# Patient Record
Sex: Female | Born: 1950 | Hispanic: No | Marital: Married | Smoking: Never smoker
Health system: Southern US, Community
[De-identification: ages and names within clinical notes are randomized; demographics above are authoritative.]

## PROBLEM LIST (undated history)

## (undated) DIAGNOSIS — I671 Cerebral aneurysm, nonruptured: Secondary | ICD-10-CM

## (undated) DIAGNOSIS — I517 Cardiomegaly: Secondary | ICD-10-CM

## (undated) DIAGNOSIS — I5030 Unspecified diastolic (congestive) heart failure: Secondary | ICD-10-CM

## (undated) DIAGNOSIS — I10 Essential (primary) hypertension: Secondary | ICD-10-CM

## (undated) DIAGNOSIS — H4922 Sixth [abducent] nerve palsy, left eye: Secondary | ICD-10-CM

## (undated) DIAGNOSIS — G4733 Obstructive sleep apnea (adult) (pediatric): Secondary | ICD-10-CM

## (undated) DIAGNOSIS — E785 Hyperlipidemia, unspecified: Secondary | ICD-10-CM

## (undated) DIAGNOSIS — C9110 Chronic lymphocytic leukemia of B-cell type not having achieved remission: Secondary | ICD-10-CM

## (undated) DIAGNOSIS — D509 Iron deficiency anemia, unspecified: Secondary | ICD-10-CM

## (undated) DIAGNOSIS — H40059 Ocular hypertension, unspecified eye: Secondary | ICD-10-CM

## (undated) DIAGNOSIS — H532 Diplopia: Secondary | ICD-10-CM

## (undated) DIAGNOSIS — N184 Chronic kidney disease, stage 4 (severe): Secondary | ICD-10-CM

## (undated) HISTORY — PX: ROTATOR CUFF REPAIR: SHX139

## (undated) HISTORY — DX: Ocular hypertension, unspecified eye: H40.059

## (undated) HISTORY — PX: CARPAL TUNNEL RELEASE: SHX101

## (undated) HISTORY — PX: KIDNEY CYST REMOVAL: SHX684

## (undated) HISTORY — DX: Chronic lymphocytic leukemia of B-cell type not having achieved remission: C91.10

## (undated) HISTORY — DX: Cerebral aneurysm, nonruptured: I67.1

## (undated) HISTORY — DX: Hyperlipidemia, unspecified: E78.5

## (undated) HISTORY — PX: ABDOMINAL HYSTERECTOMY: SHX81

## (undated) HISTORY — DX: Cardiomegaly: I51.7

## (undated) HISTORY — DX: Unspecified diastolic (congestive) heart failure: I50.30

## (undated) HISTORY — DX: Diplopia: H53.2

## (undated) HISTORY — DX: Sixth (abducent) nerve palsy, left eye: H49.22

## (undated) HISTORY — DX: Obstructive sleep apnea (adult) (pediatric): G47.33

## (undated) HISTORY — DX: Iron deficiency anemia, unspecified: D50.9

## (undated) HISTORY — DX: Chronic kidney disease, stage 4 (severe): N18.4

---

## 2011-12-19 ENCOUNTER — Emergency Department (HOSPITAL_BASED_OUTPATIENT_CLINIC_OR_DEPARTMENT_OTHER)
Admission: EM | Admit: 2011-12-19 | Discharge: 2011-12-20 | Disposition: A | Discharge status: Home / Self Care | Payer: Medicare Other | Attending: Emergency Medicine | Admitting: Emergency Medicine

## 2011-12-19 ENCOUNTER — Encounter (HOSPITAL_BASED_OUTPATIENT_CLINIC_OR_DEPARTMENT_OTHER): Payer: Self-pay | Admitting: Emergency Medicine

## 2011-12-19 DIAGNOSIS — Z79899 Other long term (current) drug therapy: Secondary | ICD-10-CM | POA: Insufficient documentation

## 2011-12-19 DIAGNOSIS — R609 Edema, unspecified: Secondary | ICD-10-CM

## 2011-12-19 DIAGNOSIS — I10 Essential (primary) hypertension: Secondary | ICD-10-CM | POA: Insufficient documentation

## 2011-12-19 DIAGNOSIS — E119 Type 2 diabetes mellitus without complications: Secondary | ICD-10-CM | POA: Insufficient documentation

## 2011-12-19 DIAGNOSIS — Z856 Personal history of leukemia: Secondary | ICD-10-CM | POA: Insufficient documentation

## 2011-12-19 HISTORY — DX: Essential (primary) hypertension: I10

## 2011-12-19 NOTE — ED Notes (Signed)
Around 7pm reports swelling and fever in lower extremities.  C/o a tight feeling.

## 2011-12-20 LAB — CBC WITH DIFFERENTIAL/PLATELET
Basophils Absolute: 0 10*3/uL (ref 0.0–0.1)
Basophils Relative: 0 % (ref 0–1)
Eosinophils Absolute: 0.1 10*3/uL (ref 0.0–0.7)
HCT: 37.7 % (ref 36.0–46.0)
Hemoglobin: 12.4 g/dL (ref 12.0–15.0)
Lymphs Abs: 8 10*3/uL — ABNORMAL HIGH (ref 0.7–4.0)
MCH: 21.9 pg — ABNORMAL LOW (ref 26.0–34.0)
MCHC: 32.9 g/dL (ref 30.0–36.0)
Monocytes Absolute: 0.5 10*3/uL (ref 0.1–1.0)
Neutro Abs: 2.1 10*3/uL (ref 1.7–7.7)

## 2011-12-20 LAB — COMPREHENSIVE METABOLIC PANEL
AST: 19 U/L (ref 0–37)
Albumin: 3.2 g/dL — ABNORMAL LOW (ref 3.5–5.2)
Alkaline Phosphatase: 128 U/L — ABNORMAL HIGH (ref 39–117)
BUN: 15 mg/dL (ref 6–23)
Chloride: 95 mEq/L — ABNORMAL LOW (ref 96–112)
Potassium: 3.4 mEq/L — ABNORMAL LOW (ref 3.5–5.1)
Sodium: 135 mEq/L (ref 135–145)
Total Bilirubin: 0.2 mg/dL — ABNORMAL LOW (ref 0.3–1.2)
Total Protein: 7.5 g/dL (ref 6.0–8.3)

## 2011-12-20 LAB — TROPONIN I: Troponin I: <0.3 ng/mL (ref <0.30)

## 2011-12-20 MED ORDER — FUROSEMIDE 10 MG/ML IJ SOLN
40.0000 mg | Freq: Once | INTRAMUSCULAR | Status: AC
  Administered 2011-12-20: 40 mg via INTRAVENOUS
  Filled 2011-12-20: qty 4

## 2011-12-20 NOTE — ED Provider Notes (Signed)
History     CSN: [id number]  Arrival date & time 12/19/11  2234   First MD Initiated Contact with Patient 12/20/11 0124      Chief Complaint  Patient presents with  . Leg Swelling    (Consider location/radiation/quality/duration/timing/severity/associated sxs/prior treatment) HPI This is a 61 year old black female who states that her legs and ankles became swollen yesterday evening about 7 PM. She states they feel tight and there is burning discomfort in them. The symptoms are mild to moderate. There is no known exacerbating or mitigating factor. There is no erythema or warmth. She was short of breath for the last several days but attributes this to putting moth balls in her house. She saw her primary care physician yesterday who told her that she found no acute problem with her breathing. She denies chest pain, nausea or vomiting. She's not aware of being on any diuretic to treat this.  Past Medical History  Diagnosis Date  . Diabetes mellitus   . Hypertension   . Leukemia     Past Surgical History  Procedure Date  . Carpal tunnel release   . Abdominal hysterectomy   . Kidney cyst removal   . Rotator cuff repair     No family history on file.  History  Substance Use Topics  . Smoking status: Not on file  . Smokeless tobacco: Not on file  . Alcohol Use:     OB History    Grav Para Term Preterm Abortions TAB SAB Ect Mult Living                  Review of Systems  All other systems reviewed and are negative.    Allergies  Review of patient's allergies indicates not on file.  Home Medications   Current Outpatient Rx  Name Route Sig Dispense Refill  . BENAZEPRIL HCL 20 MG PO TABS Oral Take 20 mg by mouth daily.    Marland Kitchen HYDROCHLOROTHIAZIDE 25 MG PO TABS Oral Take 25 mg by mouth daily.    Marland Kitchen METOPROLOL SUCCINATE ER 100 MG PO TB24 Oral Take 100 mg by mouth 2 (two) times daily. Take with or immediately following a meal.    . NIFEDIPINE ER OSMOTIC 90 MG PO TB24 Oral  Take 90 mg by mouth daily.    Marland Kitchen OMEPRAZOLE 40 MG PO CPDR Oral Take 40 mg by mouth daily.    Marland Kitchen PRAVASTATIN SODIUM 80 MG PO TABS Oral Take 80 mg by mouth daily.      BP 154/65  Pulse 70  Temp 98.1 F (36.7 C) (Oral)  Resp 16  Ht 5\' 2"  (1.575 m)  Wt 258 lb (117.028 kg)  BMI 47.19 kg/m2  SpO2 98%  Physical Exam General: Well-developed, well-nourished female in no acute distress; appearance consistent with age of record HENT: normocephalic, atraumatic Eyes: pupils equal round and reactive to light; extraocular muscles intact; arcus senilis bilaterally Neck: supple Heart: regular rate and rhythm Lungs: clear to auscultation bilaterally Abdomen: soft; nondistended; nontender; bowel sounds present Extremities: No deformity; full range of motion; +1 edema of lower legs and feet Neurologic: Awake, alert and oriented; motor function intact in all extremities and symmetric; no facial droop Skin: Warm and dry Psychiatric: Normal mood and affect    ED Course  Procedures (including critical care time)     MDM   Nursing notes and vitals signs, including pulse oximetry, reviewed.  Summary of this visit's results, reviewed by myself:  Labs:  Results for orders placed  during the hospital encounter of 12/19/11  CBC WITH DIFFERENTIAL      Component Value Range   WBC 10.7 (*) 4.0 - 10.5 K/uL   RBC 5.67 (*) 3.87 - 5.11 MIL/uL   Hemoglobin 12.4  12.0 - 15.0 g/dL   HCT 37.7  36.0 - 46.0 %   MCV 66.5 (*) 78.0 - 100.0 fL   MCH 21.9 (*) 26.0 - 34.0 pg   MCHC 32.9  30.0 - 36.0 g/dL   RDW 14.7  11.5 - 15.5 %   Platelets 226  150 - 400 K/uL   Neutrophils Relative 20 (*) 43 - 77 %   Lymphocytes Relative 74 (*) 12 - 46 %   Monocytes Relative 5  3 - 12 %   Eosinophils Relative 1  0 - 5 %   Basophils Relative 0  0 - 1 %   Neutro Abs 2.1  1.7 - 7.7 K/uL   Lymphs Abs 8.0 (*) 0.7 - 4.0 K/uL   Monocytes Absolute 0.5  0.1 - 1.0 K/uL   Eosinophils Absolute 0.1  0.0 - 0.7 K/uL   Basophils  Absolute 0.0  0.0 - 0.1 K/uL   RBC Morphology POLYCHROMASIA PRESENT     WBC Morphology ATYPICAL LYMPHOCYTES    COMPREHENSIVE METABOLIC PANEL      Component Value Range   Sodium 135  135 - 145 mEq/L   Potassium 3.4 (*) 3.5 - 5.1 mEq/L   Chloride 95 (*) 96 - 112 mEq/L   CO2 28  19 - 32 mEq/L   Glucose, Bld 325 (*) 70 - 99 mg/dL   BUN 15  6 - 23 mg/dL   Creatinine, Ser 0.90  0.50 - 1.10 mg/dL   Calcium 9.1  8.4 - 10.5 mg/dL   Total Protein 7.5  6.0 - 8.3 g/dL   Albumin 3.2 (*) 3.5 - 5.2 g/dL   AST 19  0 - 37 U/L   ALT 13  0 - 35 U/L   Alkaline Phosphatase 128 (*) 39 - 117 U/L   Total Bilirubin 0.2 (*) 0.3 - 1.2 mg/dL   GFR calc non Af Amer 67 (*) >90 mL/min   GFR calc Af Amer 78 (*) >90 mL/min  TROPONIN I      Component Value Range   Troponin I <0.30  <0.30 ng/mL  PRO B NATRIURETIC PEPTIDE      Component Value Range   Pro B Natriuretic peptide (BNP) 59.6  0 - 125 pg/mL  D-DIMER, QUANTITATIVE      Component Value Range   D-Dimer, Quant 0.46  0.00 - 0.48 ug/mL-FEU   2:50 AM Patient advised of essentially benign workup. The source of her lower extremities swelling is not evident. She was advised that lower extremity edema are often difficult to diagnose and treat but she should contact her primary care physician Monday.   EKG Interpretation:  Date & Time: 12/20/2011 2:15 AM  Rate: 64  Rhythm: normal sinus rhythm  QRS Axis: normal  Intervals: normal  ST/T Wave abnormalities: T-wave inversions inferolaterally  Conduction Disutrbances:none  Narrative Interpretation: LVH  Old EKG Reviewed: none available         Wynetta Fines, MD 12/20/11 [id number]

## 2017-09-02 ENCOUNTER — Institutional Professional Consult (permissible substitution): Payer: Medicare Other | Admitting: Pulmonary Disease

## 2017-09-03 ENCOUNTER — Institutional Professional Consult (permissible substitution): Payer: Medicare Other | Admitting: Pulmonary Disease

## 2017-10-29 ENCOUNTER — Ambulatory Visit (INDEPENDENT_AMBULATORY_CARE_PROVIDER_SITE_OTHER): Payer: Medicare HMO | Admitting: Pulmonary Disease

## 2017-10-29 ENCOUNTER — Encounter: Payer: Self-pay | Admitting: Pulmonary Disease

## 2017-10-29 VITALS — BP 136/78 | HR 87 | Ht 62.0 in | Wt 292.0 lb

## 2017-10-29 DIAGNOSIS — G4733 Obstructive sleep apnea (adult) (pediatric): Secondary | ICD-10-CM | POA: Diagnosis not present

## 2017-10-29 DIAGNOSIS — R0683 Snoring: Secondary | ICD-10-CM | POA: Diagnosis not present

## 2017-10-29 DIAGNOSIS — G4719 Other hypersomnia: Secondary | ICD-10-CM | POA: Diagnosis not present

## 2017-10-29 DIAGNOSIS — Z6841 Body Mass Index (BMI) 40.0 and over, adult: Secondary | ICD-10-CM

## 2017-10-29 NOTE — Progress Notes (Signed)
   Subjective:    Patient ID: Amy Bush, female    DOB: 05/11/1950, 67 y.o.   MRN: [id number]  HPI    Review of Systems  Constitutional: Positive for fatigue.  HENT: Positive for congestion.   Respiratory: Positive for apnea, cough, shortness of breath and wheezing.        Objective:   Physical Exam        Assessment & Plan:

## 2017-10-29 NOTE — Patient Instructions (Signed)
Will arrange for home sleep study Will call to arrange for follow up after sleep study reviewed  

## 2017-10-29 NOTE — Progress Notes (Signed)
[facility]  Chief Complaint  Patient presents with  . Apnea    Constitutional: BP 136/78 (BP Location: Left Arm, Cuff Size: Large)   Pulse 87   Ht 5\' 2"  (1.575 m)   Wt 292 lb (132.5 kg)   SpO2 93%   BMI 53.41 kg/m   History of Present Illness: Amy Bush is a 67 y.o. female with obstructive sleep apnea.  She has noticed trouble with her sleep for years.  This has been getting worse.  She falls asleep all the time, but can't stay asleep.  She wakes up feeling choked.  She snores and has been told she stops breathing while asleep.  She has to sleep sitting up.  She had a sleep study about 3 yrs ago and was told she had sleep apnea.  Tried on CPAP.  This helped, but she couldn't get used to the mask.  Apparently only tried full face mask.  Eventually turned machine back in.  She has been falling asleep at [facility].  Several of her church members have encouraged her to look into therapy for sleep apnea again.  She goes to sleep at 11 pm.  She falls asleep in minutes.  She wakes up 4 to 5 times to use the bathroom.  She gets out of bed at 6 am.  She feels tired in the morning.  She denies morning headache.  She does not use anything to help her fall sleep or stay awake.  She denies sleep walking, sleep talking, bruxism, or nightmares.  There is no history of restless legs.  She denies sleep hallucinations, sleep paralysis, or cataplexy.  The Epworth score is 24 out of 24.  Comprehensive Respiratory Exam:  Appearance - well kempt  ENMT - nasal mucosa moist, turbinates clear, midline nasal septum, poor dentition dental lesions, no gingival bleeding, no oral exudates, no tonsillar hypertrophy, MP 3 Neck - no masses, trachea midline, no thyromegaly, no elevation in JVP Respiratory - normal appearance of chest wall, normal respiratory effort w/o accessory muscle use, no dullness on percussion, no wheezing or rales CV - s1s2 regular rate and rhythm,  2/6 murmurs, 1+ non pitting edema, radial pulses symmetric GI - soft, non tender, no masses, no hepatosplenomegaly Lymph - no adenopathy noted in neck and axillary areas MSK - normal muscle strength and tone, sitting in wheelchair Ext - no cyanosis, clubbing, or joint inflammation noted Skin - no rashes, lesions, or ulcers Neuro - oriented to person, place, and time Psych - normal mood and affect  Discussion: She has snoring, sleep disruption, apnea, and daytime sleepiness.  She has history of hypertension and diabetes.  She has prior history of sleep apnea, but had difficulty tolerating CPAP related to mask fit.  She only tried full face mask before.  I am almost certain she still has sleep apnea.  Assessment/Plan:  Snoring with excessive daytime sleepiness. - will need to arrange for a home sleep study  Obesity. - discussed how weight can impact sleep and risk for sleep disordered breathing - discussed options to assist with weight loss: combination of diet modification, cardiovascular and strength training exercises  Cardiovascular risk. - had an extensive discussion regarding the adverse health consequences related to untreated sleep disordered breathing - specifically discussed the risks for hypertension, coronary artery disease, cardiac dysrhythmias, cerebrovascular disease, and diabetes - lifestyle modification discussed  Safe driving practices. - discussed how sleep disruption can increase risk of accidents, particularly when driving - safe  driving practices were discussed  Therapies for obstructive sleep apnea. - if the sleep study shows significant sleep apnea, then various therapies for treatment were reviewed: CPAP, oral appliance, and surgical interventions - due to poor dentition, I don't think an oral appliance would be a suitable option for her   Patient Instructions  Will arrange for home sleep study Will call to arrange for follow up after sleep study  reviewed     Chesley Mires, MD [place] 10/29/2017, 9:54 AM  Flow Sheet  Sleep tests:  Cardiac tests: Echo 12/29/16 >> EF 65 to 70%, mod LVH, mild MR  Review of Systems: Constitutional: Positive for fatigue.  HENT: Positive for congestion.   Respiratory: Positive for apnea, cough, shortness of breath and wheezing.   Remainder negative.  Past Medical History: She  has a past medical history of Aneurysm of posterior cerebral artery, CKD (chronic kidney disease) stage 4, GFR 15-29 ml/min (HCC), CLL (chronic lymphocytic leukemia) ([place]), Diabetes mellitus, Diastolic CHF ([place]), Diplopia, Hyperlipidemia, Hypertension, LVH (left ventricular hypertrophy), Microcytic anemia, Ocular hypertension, OSA (obstructive sleep apnea), and VI nerve palsy, left.  Past Surgical History: She  has a past surgical history that includes Carpal tunnel release; Abdominal hysterectomy; Kidney cyst removal; and Rotator cuff repair.  Family History: Her family history includes CAD in her father; Diabetes in her brother, mother, and son; Hypertension in her daughter, mother, and son; Kidney disease in her mother.  Social History: She  reports that she has never smoked. She has never used smokeless tobacco. She reports that she drank alcohol. She reports that she does not use drugs.  Medications: Allergies as of 10/29/2017      Reactions   Penicillins Hives      Medication List        Accurate as of 10/29/17  9:54 AM. Always use your most recent med list.          benazepril 20 MG tablet Commonly known as:  LOTENSIN Take 20 mg by mouth daily.   hydrochlorothiazide 25 MG tablet Commonly known as:  HYDRODIURIL Take 25 mg by mouth daily.   metoprolol succinate 100 MG 24 hr tablet Commonly known as:  TOPROL-XL Take 100 mg by mouth 2 (two) times daily. Take with or immediately following a meal.   NIFEdipine 90 MG 24 hr tablet Commonly known as:  PROCARDIA XL/ADALAT-CC Take 90 mg  by mouth daily.   omeprazole 40 MG capsule Commonly known as:  PRILOSEC Take 40 mg by mouth daily.   pravastatin 80 MG tablet Commonly known as:  PRAVACHOL Take 80 mg by mouth daily.

## 2017-11-11 ENCOUNTER — Other Ambulatory Visit: Payer: Self-pay | Admitting: Pulmonary Disease

## 2017-11-11 DIAGNOSIS — G4719 Other hypersomnia: Secondary | ICD-10-CM

## 2017-11-11 DIAGNOSIS — R0683 Snoring: Secondary | ICD-10-CM

## 2017-11-11 NOTE — Progress Notes (Signed)
Patient requested to have in lab sleep study instead of home sleep study.   Order placed for split night study.

## 2017-12-01 ENCOUNTER — Encounter (HOSPITAL_BASED_OUTPATIENT_CLINIC_OR_DEPARTMENT_OTHER): Payer: Medicare HMO

## 2018-02-15 ENCOUNTER — Ambulatory Visit: Payer: Self-pay

## 2018-02-15 NOTE — Telephone Encounter (Signed)
Patient called and says "I am having problems with my lungs and my feet are swollen. I can't even walk from one room to the next without giving out of breath. I need to see someone about my lungs." I asked the patient who is her primary care doctor, she says "Amy Bush." I asked with [facility], she says "yes." I asked did she call her, because we are [facility]. She says "I was supposed to be calling the pulmonary. Lauren gave me your number, [phone] to speak to pulmonary." I advised she should go to the ED if she's not breathing good. She says "that's all I do is go to the emergency room and I need to talk to someone about my lungs, because nobody is listening to me." I asked did she want me to transfer her to [facility], she says "yes." I called and spoke to [facility] who says transfer the patient to her. I advised the patient I will be transferring her call, she verbalized understanding, call transferred successfully.

## 2018-03-11 ENCOUNTER — Ambulatory Visit: Payer: Medicare HMO | Admitting: Adult Health

## 2019-03-29 ENCOUNTER — Emergency Department (HOSPITAL_BASED_OUTPATIENT_CLINIC_OR_DEPARTMENT_OTHER)
Admission: EM | Admit: 2019-03-29 | Discharge: 2019-03-29 | Disposition: A | Discharge status: Home / Self Care | Payer: Medicare HMO | Attending: Emergency Medicine | Admitting: Emergency Medicine

## 2019-03-29 ENCOUNTER — Emergency Department (HOSPITAL_BASED_OUTPATIENT_CLINIC_OR_DEPARTMENT_OTHER): Payer: Medicare HMO

## 2019-03-29 ENCOUNTER — Encounter (HOSPITAL_BASED_OUTPATIENT_CLINIC_OR_DEPARTMENT_OTHER): Payer: Self-pay | Admitting: Emergency Medicine

## 2019-03-29 ENCOUNTER — Other Ambulatory Visit: Payer: Self-pay

## 2019-03-29 DIAGNOSIS — R1011 Right upper quadrant pain: Secondary | ICD-10-CM | POA: Diagnosis not present

## 2019-03-29 DIAGNOSIS — I5030 Unspecified diastolic (congestive) heart failure: Secondary | ICD-10-CM | POA: Insufficient documentation

## 2019-03-29 DIAGNOSIS — N184 Chronic kidney disease, stage 4 (severe): Secondary | ICD-10-CM | POA: Insufficient documentation

## 2019-03-29 DIAGNOSIS — R0789 Other chest pain: Secondary | ICD-10-CM | POA: Diagnosis not present

## 2019-03-29 DIAGNOSIS — Z79899 Other long term (current) drug therapy: Secondary | ICD-10-CM | POA: Insufficient documentation

## 2019-03-29 DIAGNOSIS — I130 Hypertensive heart and chronic kidney disease with heart failure and stage 1 through stage 4 chronic kidney disease, or unspecified chronic kidney disease: Secondary | ICD-10-CM | POA: Insufficient documentation

## 2019-03-29 DIAGNOSIS — E1122 Type 2 diabetes mellitus with diabetic chronic kidney disease: Secondary | ICD-10-CM | POA: Insufficient documentation

## 2019-03-29 LAB — URINALYSIS, ROUTINE W REFLEX MICROSCOPIC
Bilirubin Urine: NEGATIVE
Glucose, UA: 250 mg/dL — AB
Ketones, ur: NEGATIVE mg/dL
Leukocytes,Ua: NEGATIVE
Nitrite: NEGATIVE
Protein, ur: >300 mg/dL — AB
Specific Gravity, Urine: 1.02 (ref 1.005–1.030)
pH: 7.5 (ref 5.0–8.0)

## 2019-03-29 LAB — COMPREHENSIVE METABOLIC PANEL
ALT: 15 U/L (ref 0–44)
AST: 18 U/L (ref 15–41)
Albumin: 3.6 g/dL (ref 3.5–5.0)
Alkaline Phosphatase: 98 U/L (ref 38–126)
Anion gap: 12 (ref 5–15)
BUN: 16 mg/dL (ref 8–23)
CO2: 27 mmol/L (ref 22–32)
Calcium: 8.5 mg/dL — ABNORMAL LOW (ref 8.9–10.3)
Chloride: 99 mmol/L (ref 98–111)
Creatinine, Ser: 3.92 mg/dL — ABNORMAL HIGH (ref 0.44–1.00)
GFR calc Af Amer: 13 mL/min — ABNORMAL LOW (ref >60)
GFR calc non Af Amer: 11 mL/min — ABNORMAL LOW (ref >60)
Glucose, Bld: 158 mg/dL — ABNORMAL HIGH (ref 70–99)
Potassium: 3.6 mmol/L (ref 3.5–5.1)
Sodium: 138 mmol/L (ref 135–145)
Total Bilirubin: 0.2 mg/dL — ABNORMAL LOW (ref 0.3–1.2)
Total Protein: 7.1 g/dL (ref 6.5–8.1)

## 2019-03-29 LAB — URINALYSIS, MICROSCOPIC (REFLEX)

## 2019-03-29 LAB — CBC
HCT: 40.9 % (ref 36.0–46.0)
Hemoglobin: 11.7 g/dL — ABNORMAL LOW (ref 12.0–15.0)
MCH: 22 pg — ABNORMAL LOW (ref 26.0–34.0)
MCHC: 28.6 g/dL — ABNORMAL LOW (ref 30.0–36.0)
MCV: 77 fL — ABNORMAL LOW (ref 80.0–100.0)
Platelets: 269 10*3/uL (ref 150–400)
RBC: 5.31 MIL/uL — ABNORMAL HIGH (ref 3.87–5.11)
RDW: 16.6 % — ABNORMAL HIGH (ref 11.5–15.5)
WBC: 8.4 10*3/uL (ref 4.0–10.5)
nRBC: 0 % (ref 0.0–0.2)

## 2019-03-29 LAB — LIPASE, BLOOD: Lipase: 26 U/L (ref 11–51)

## 2019-03-29 NOTE — ED Notes (Signed)
Patient transported to X-ray 

## 2019-03-29 NOTE — Discharge Instructions (Addendum)
Please follow up with your primary care provider regarding your visit today. You can continue treating your pain with tylenol as needed. Return to the ER if you develop fever or severely worsening symptoms.

## 2019-03-29 NOTE — ED Notes (Signed)
Correction to triage note. Pt is reporting RUQ pain. Pt states it is more on top of her ribs and is tender to touch.

## 2019-03-29 NOTE — ED Provider Notes (Signed)
[facility] Provider Note   CSN: [id number] Arrival date & time: 03/29/19  1424     History Chief Complaint  Patient presents with  . Abdominal Pain    Amy Bush is a 68 y.o. female with past medical history of CKD on HD MWF, hypertension, hyperlipidemia, diabetes, presenting to the emergency department with complaint of intermittent right upper quadrant pain that began about a week ago.  Symptoms come and go.  She states the pain is in her right upper quadrant or right lower ribs that is worse with palpation.  She is not having any chest pain or shortness of breath, nor is she having cough or fever.  She reports normal bowel movements.  Denies nausea, vomiting, diarrhea or constipation.  Denies rashes. She is noted to be hypertensive on arrival.  Reports compliance with blood pressure medications though states she has been under a lot of stress over the last couple of weeks with multiple family members critically ill and with one of her siblings recently passing.  The history is provided by the patient.       Past Medical History:  Diagnosis Date  . Aneurysm of posterior cerebral artery   . CKD (chronic kidney disease) stage 4, GFR 15-29 ml/min (HCC)   . CLL (chronic lymphocytic leukemia) ([place])   . Diabetes mellitus   . Diastolic CHF ([place])   . Diplopia   . Hyperlipidemia   . Hypertension   . LVH (left ventricular hypertrophy)   . Microcytic anemia   . Ocular hypertension   . OSA (obstructive sleep apnea)   . VI nerve palsy, left     There are no problems to display for this patient.   Past Surgical History:  Procedure Laterality Date  . ABDOMINAL HYSTERECTOMY    . CARPAL TUNNEL RELEASE    . KIDNEY CYST REMOVAL    . ROTATOR CUFF REPAIR       OB History   No obstetric history on file.     Family History  Problem Relation Age of Onset  . Hypertension Mother   . Diabetes Mother   . Kidney disease Mother   . CAD Father   .  Diabetes Brother   . Diabetes Son   . Hypertension Son   . Hypertension Daughter     Social History   Tobacco Use  . Smoking status: Never Smoker  . Smokeless tobacco: Never Used  Substance Use Topics  . Alcohol use: Not Currently  . Drug use: Never    Home Medications Prior to Admission medications   Medication Sig Start Date End Date Taking? Authorizing Provider  benazepril (LOTENSIN) 20 MG tablet Take 20 mg by mouth daily.    [provider]  hydrochlorothiazide (HYDRODIURIL) 25 MG tablet Take 25 mg by mouth daily.    [provider]  metoprolol succinate (TOPROL-XL) 100 MG 24 hr tablet Take 100 mg by mouth 2 (two) times daily. Take with or immediately following a meal.    [provider]  NIFEdipine (PROCARDIA XL/ADALAT-CC) 90 MG 24 hr tablet Take 90 mg by mouth daily.    [provider]  omeprazole (PRILOSEC) 40 MG capsule Take 40 mg by mouth daily.    [provider]  pravastatin (PRAVACHOL) 80 MG tablet Take 80 mg by mouth daily.    [provider]    Allergies    Penicillins  Review of Systems   Review of Systems  All other systems reviewed and  are negative.   Physical Exam Updated Vital Signs BP (!) 158/83   Pulse 97   Temp 98 F (36.7 C) (Oral)   Resp 20   Ht 5\' 2"  (1.575 m)   Wt 122.5 kg   SpO2 93%   BMI 49.38 kg/m   Physical Exam Vitals and nursing note reviewed.  Constitutional:      General: She is not in acute distress.    Appearance: She is well-developed. She is obese. She is not ill-appearing.  HENT:     Head: Normocephalic and atraumatic.  Eyes:     Conjunctiva/sclera: Conjunctivae normal.  Cardiovascular:     Rate and Rhythm: Normal rate and regular rhythm.  Pulmonary:     Effort: Pulmonary effort is normal. No respiratory distress.     Breath sounds: Normal breath sounds.  Chest:    Abdominal:     General: A surgical scar is present. Bowel sounds are normal.     Palpations:  Abdomen is soft.     Tenderness: There is abdominal tenderness in the right upper quadrant. There is no guarding or rebound. Negative signs include Murphy's sign.     Comments: TTP to RUQ and right lower anterior ribs.   Skin:    General: Skin is warm.  Neurological:     Mental Status: She is alert.  Psychiatric:        Behavior: Behavior normal.     ED Results / Procedures / Treatments   Labs (all labs ordered are listed, but only abnormal results are displayed) Labs Reviewed  COMPREHENSIVE METABOLIC PANEL - Abnormal; Notable for the following components:      Result Value   Glucose, Bld 158 (*)    Creatinine, Ser 3.92 (*)    Calcium 8.5 (*)    Total Bilirubin 0.2 (*)    GFR calc non Af Amer 11 (*)    GFR calc Af Amer 13 (*)    All other components within normal limits  CBC - Abnormal; Notable for the following components:   RBC 5.31 (*)    Hemoglobin 11.7 (*)    MCV 77.0 (*)    MCH 22.0 (*)    MCHC 28.6 (*)    RDW 16.6 (*)    All other components within normal limits  URINALYSIS, ROUTINE W REFLEX MICROSCOPIC - Abnormal; Notable for the following components:   Glucose, UA 250 (*)    Hgb urine dipstick MODERATE (*)    Protein, ur >300 (*)    All other components within normal limits  URINALYSIS, MICROSCOPIC (REFLEX) - Abnormal; Notable for the following components:   Bacteria, UA FEW (*)    All other components within normal limits  LIPASE, BLOOD    EKG None  Radiology CT Abdomen Pelvis Wo Contrast  Result Date: 03/29/2019 CLINICAL DATA:  Right upper quadrant pain EXAM: CT ABDOMEN AND PELVIS WITHOUT CONTRAST TECHNIQUE: Multidetector CT imaging of the abdomen and pelvis was performed following the standard protocol without IV contrast. COMPARISON:  Ultrasound 03/29/2019, CT 11/19/2017 FINDINGS: Lower chest: Lung bases demonstrate no acute consolidation or effusion. Heart size within normal limits. Small hiatal hernia Hepatobiliary: No focal hepatic abnormality.  Slightly nodular hepatic contour. No calcified gallstone or biliary dilatation Pancreas: Unremarkable. No pancreatic ductal dilatation or surrounding inflammatory changes. Spleen: Normal in size without focal abnormality. Adrenals/Urinary Tract: Adrenal glands are normal. No hydronephrosis. Stable low-density lesion in the mid left kidney presumably a cyst. Nonspecific left greater than right perinephric fat stranding. Urinary bladder  is nearly empty. Stomach/Bowel: Stomach is within normal limits. Appendix not well seen but no right lower quadrant inflammatory process. Scattered sigmoid colon diverticula without acute inflammatory change. No evidence of bowel wall thickening, distention, or inflammatory changes. Vascular/Lymphatic: Mild to moderate aortic atherosclerosis. No aneurysmal dilatation. Subcentimeter retroperitoneal nodes similar compared to prior. Reproductive: Status post hysterectomy.  No adnexal mass Other: Negative for free air or free fluid Musculoskeletal: No acute or suspicious osseous abnormality. Advanced degenerative change L5-S1. IMPRESSION: 1. No CT evidence for acute intra-abdominal or pelvic abnormality. 2. Nonspecific left greater than right perinephric fat stranding increased compared to prior CT, suggest correlation with renal function tests, or potentially urinalysis if urinary tract infection is a clinical concern. 3. Mild sigmoid colon diverticula without acute inflammatory change 4. Slightly nodular hepatic contour, possible cirrhosis. Electronically Signed   By: Donavan Foil M.D.   On: 03/29/2019 21:06   DG Chest 2 View  Result Date: 03/29/2019 CLINICAL DATA:  Right upper quadrant abdominal pain. EXAM: CHEST - 2 VIEW COMPARISON:  01/25/2019 FINDINGS: There is a well-positioned right-sided tunneled dialysis catheter. The heart size is enlarged. There is vascular congestion without overt pulmonary edema. There is no large pleural effusion. Bronchitic changes are noted at the  lung bases bilaterally. IMPRESSION: 1. No acute cardiopulmonary process. 2. Well-positioned tunneled dialysis catheter. 3. Cardiomegaly with mild vascular congestion. Electronically Signed   By: Constance Holster M.D.   On: 03/29/2019 19:46   US Abdomen Limited RUQ  Result Date: 03/29/2019 CLINICAL DATA:  Pain EXAM: ULTRASOUND ABDOMEN LIMITED RIGHT UPPER QUADRANT COMPARISON:  CT dated 11/19/2017. FINDINGS: Gallbladder: No gallstones or wall thickening visualized. No sonographic Murphy sign noted by sonographer. Common bile duct: Diameter: 3 mm Liver: No focal lesion identified. Within normal limits in parenchymal echogenicity. Portal vein is patent on color Doppler imaging with normal direction of blood flow towards the liver. Other: None. IMPRESSION: Normal study.  No acute abnormality. Electronically Signed   By: Constance Holster M.D.   On: 03/29/2019 19:47    Procedures Procedures (including critical care time)  Medications Ordered in ED Medications - No data to display  ED Course  I have reviewed the triage vital signs and the nursing notes.  Pertinent labs & imaging results that were available during my care of the patient were reviewed by me and considered in my medical decision making (see chart for details).    MDM Rules/Calculators/A&P                      Pt presenting with intermittent RUQ/right lower anterior chest wall pain over the last few days. Pain comes and goes, does not seem to be exacerbated by meals or movement, however is reproducible with palpation. No other assoc abdominal symptoms, no fever, no SOB or cough. Pain is reproducible with palpation. Abd soft without peritoneal signs. Lungs CTAB. Given location of sx, CXR and RUQ U/S ordered.  Labs without leukocytosis.  Creatinine appears to be at patient's baseline.  Lipase is normal.  Normal LFTs.  Chest x-ray and ultrasound are negative.  Patient discussed with and evaluated by Dr. Roslynn Amble.  Proceeded with CT scan  for further evaluation which is also negative for acute intra-abdominal pathology.  Patient was noted to be hypertensive on arrival though after further discussion it appears she has been only been taking her morning doses of blood pressure medication as she is awaiting refills of her medication to be sent via mail.  She is having  no other signs or symptoms of acute endorgan damage.  Blood pressure improved throughout ED stay.  Discussed symptomatic management and PCP follow-up.  Patient is well-appearing in no distress.  Patient agreeable to plan and safe for discharge.  Discussed results, findings, treatment and follow up. Patient advised of return precautions. Patient verbalized understanding and agreed with plan.  Final Clinical Impression(s) / ED Diagnoses Final diagnoses:  RUQ abdominal pain  Chest wall pain    Rx / DC Orders ED Discharge Orders    None       Julieta Rogalski, [place] N, PA-C 03/29/19 2242    Lucrezia Starch, MD 03/30/19 [phone]

## 2019-03-29 NOTE — ED Triage Notes (Signed)
Intermittent LUQ pain x 1 week. Denies N/V/D

## 2019-04-27 ENCOUNTER — Other Ambulatory Visit: Payer: Self-pay | Admitting: *Deleted

## 2019-04-27 DIAGNOSIS — Z20822 Contact with and (suspected) exposure to covid-19: Secondary | ICD-10-CM

## 2019-04-28 LAB — NOVEL CORONAVIRUS, NAA: SARS-CoV-2, NAA: NOT DETECTED

## 2019-11-23 ENCOUNTER — Emergency Department (HOSPITAL_BASED_OUTPATIENT_CLINIC_OR_DEPARTMENT_OTHER)
Admission: EM | Admit: 2019-11-23 | Discharge: 2019-11-23 | Disposition: A | Discharge status: Home / Self Care | Payer: Medicare HMO | Attending: Emergency Medicine | Admitting: Emergency Medicine

## 2019-11-23 ENCOUNTER — Encounter (HOSPITAL_BASED_OUTPATIENT_CLINIC_OR_DEPARTMENT_OTHER): Payer: Self-pay

## 2019-11-23 ENCOUNTER — Other Ambulatory Visit: Payer: Self-pay

## 2019-11-23 ENCOUNTER — Emergency Department (HOSPITAL_BASED_OUTPATIENT_CLINIC_OR_DEPARTMENT_OTHER): Payer: Medicare HMO

## 2019-11-23 DIAGNOSIS — E162 Hypoglycemia, unspecified: Secondary | ICD-10-CM

## 2019-11-23 DIAGNOSIS — I130 Hypertensive heart and chronic kidney disease with heart failure and stage 1 through stage 4 chronic kidney disease, or unspecified chronic kidney disease: Secondary | ICD-10-CM | POA: Insufficient documentation

## 2019-11-23 DIAGNOSIS — M5442 Lumbago with sciatica, left side: Secondary | ICD-10-CM | POA: Diagnosis present

## 2019-11-23 DIAGNOSIS — E11649 Type 2 diabetes mellitus with hypoglycemia without coma: Secondary | ICD-10-CM | POA: Insufficient documentation

## 2019-11-23 DIAGNOSIS — I5032 Chronic diastolic (congestive) heart failure: Secondary | ICD-10-CM | POA: Insufficient documentation

## 2019-11-23 DIAGNOSIS — Z79899 Other long term (current) drug therapy: Secondary | ICD-10-CM | POA: Diagnosis not present

## 2019-11-23 DIAGNOSIS — M5430 Sciatica, unspecified side: Secondary | ICD-10-CM

## 2019-11-23 DIAGNOSIS — N184 Chronic kidney disease, stage 4 (severe): Secondary | ICD-10-CM | POA: Insufficient documentation

## 2019-11-23 LAB — CBC WITH DIFFERENTIAL/PLATELET
Abs Immature Granulocytes: 0.03 10*3/uL (ref 0.00–0.07)
Basophils Absolute: 0 10*3/uL (ref 0.0–0.1)
Basophils Relative: 0 %
Eosinophils Absolute: 0.2 10*3/uL (ref 0.0–0.5)
Eosinophils Relative: 2 %
HCT: 40.6 % (ref 36.0–46.0)
Hemoglobin: 12 g/dL (ref 12.0–15.0)
Immature Granulocytes: 0 %
Lymphocytes Relative: 34 %
Lymphs Abs: 2.7 10*3/uL (ref 0.7–4.0)
MCH: 22.9 pg — ABNORMAL LOW (ref 26.0–34.0)
MCHC: 29.6 g/dL — ABNORMAL LOW (ref 30.0–36.0)
MCV: 77.6 fL — ABNORMAL LOW (ref 80.0–100.0)
Monocytes Absolute: 0.5 10*3/uL (ref 0.1–1.0)
Monocytes Relative: 6 %
Neutro Abs: 4.5 10*3/uL (ref 1.7–7.7)
Neutrophils Relative %: 58 %
Platelets: 226 10*3/uL (ref 150–400)
RBC: 5.23 MIL/uL — ABNORMAL HIGH (ref 3.87–5.11)
RDW: 14.2 % (ref 11.5–15.5)
WBC: 7.8 10*3/uL (ref 4.0–10.5)
nRBC: 0 % (ref 0.0–0.2)

## 2019-11-23 LAB — COMPREHENSIVE METABOLIC PANEL
ALT: 9 U/L (ref 0–44)
AST: 12 U/L — ABNORMAL LOW (ref 15–41)
Albumin: 3.4 g/dL — ABNORMAL LOW (ref 3.5–5.0)
Alkaline Phosphatase: 70 U/L (ref 38–126)
Anion gap: 10 (ref 5–15)
BUN: 21 mg/dL (ref 8–23)
CO2: 27 mmol/L (ref 22–32)
Calcium: 8 mg/dL — ABNORMAL LOW (ref 8.9–10.3)
Chloride: 103 mmol/L (ref 98–111)
Creatinine, Ser: 4.65 mg/dL — ABNORMAL HIGH (ref 0.44–1.00)
GFR calc Af Amer: 10 mL/min — ABNORMAL LOW (ref >60)
GFR calc non Af Amer: 9 mL/min — ABNORMAL LOW (ref >60)
Glucose, Bld: 77 mg/dL (ref 70–99)
Potassium: 3.7 mmol/L (ref 3.5–5.1)
Sodium: 140 mmol/L (ref 135–145)
Total Bilirubin: 0.1 mg/dL — ABNORMAL LOW (ref 0.3–1.2)
Total Protein: 6.7 g/dL (ref 6.5–8.1)

## 2019-11-23 LAB — CBG MONITORING, ED
Glucose-Capillary: 56 mg/dL — ABNORMAL LOW (ref 70–99)
Glucose-Capillary: 96 mg/dL (ref 70–99)

## 2019-11-23 MED ORDER — ACETAMINOPHEN 500 MG PO TABS
1000.0000 mg | ORAL_TABLET | Freq: Once | ORAL | Status: AC
  Administered 2019-11-23: 1000 mg via ORAL
  Filled 2019-11-23: qty 2

## 2019-11-23 MED ORDER — OXYCODONE HCL 5 MG PO TABS
5.0000 mg | ORAL_TABLET | Freq: Once | ORAL | Status: AC
  Administered 2019-11-23: 5 mg via ORAL
  Filled 2019-11-23: qty 1

## 2019-11-23 MED ORDER — DICLOFENAC SODIUM 1 % EX GEL: 4.0000 g | Freq: Four times a day (QID) | CUTANEOUS | 0 refills | Status: AC

## 2019-11-23 MED ORDER — KETOROLAC TROMETHAMINE 30 MG/ML IJ SOLN
15.0000 mg | Freq: Once | INTRAMUSCULAR | Status: AC
  Administered 2019-11-23: 15 mg via INTRAVENOUS
  Filled 2019-11-23: qty 1

## 2019-11-23 MED FILL — DICLOFENAC SODIUM 1 % GEL: 1 | 6 days supply | Qty: 100 | Fill #0

## 2019-11-23 NOTE — ED Triage Notes (Signed)
Pt reports left hip pain 2 months, xray at that time, having physical therapy, pain worse last night.  Per ems glucometer 45 on their arrival, gave oral food, up to 75.  Pt states pain with ambulation, worse to sit.

## 2019-11-23 NOTE — ED Notes (Signed)
CBG reading 56, pt alert, oreinted.  Provided snack and juice.

## 2019-11-23 NOTE — Discharge Instructions (Addendum)
Hold your insulin today and tomorrow.  Please discuss this with your family doctor they may want to change your dosage based on your kidney function.  Please return for worsening pain if you develop a fever or start having chest pain or shortness of breath.  Take 4 over the counter ibuprofen tablets 3 times a day or 2 over-the-counter naproxen tablets twice a day for pain. Use the gel as prescribed

## 2019-11-23 NOTE — ED Provider Notes (Signed)
[facility] Provider Note   CSN: [id number] Arrival date & time: 11/23/19  9833     History Chief Complaint  Patient presents with  . Hip Pain    Amy Bush is a 69 y.o. female.  69 yo F with a chief complaints of left-sided low back pain that radiates down to the knee.  Is been going on since last night.  She tells me she has never had this pain before.  Denies loss of bowel or bladder denies loss of peritoneal sensation.  Had a fall a few months ago but no recent injury.  Denies fevers denies back surgery.  Denies new physical therapy regimen.  Denies numbness or tingling to the leg.  Patient's medical record was reviewed and she does have a history of similar complaints.  Has seen orthopedic as an outpatient.  The history is provided by the patient and the EMS personnel.  Hip Pain This is a new problem. The current episode started yesterday. The problem occurs constantly. The problem has not changed since onset.Pertinent negatives include no chest pain, no abdominal pain, no headaches and no shortness of breath. The symptoms are aggravated by bending, twisting, standing and walking. Nothing relieves the symptoms. She has tried nothing for the symptoms. The treatment provided no relief.       Past Medical History:  Diagnosis Date  . Aneurysm of posterior cerebral artery   . CKD (chronic kidney disease) stage 4, GFR 15-29 ml/min (HCC)   . CLL (chronic lymphocytic leukemia) ([place])   . Diabetes mellitus   . Diastolic CHF ([place])   . Diplopia   . Hyperlipidemia   . Hypertension   . LVH (left ventricular hypertrophy)   . Microcytic anemia   . Ocular hypertension   . OSA (obstructive sleep apnea)   . VI nerve palsy, left     There are no problems to display for this patient.   Past Surgical History:  Procedure Laterality Date  . ABDOMINAL HYSTERECTOMY    . CARPAL TUNNEL RELEASE    . KIDNEY CYST REMOVAL    . ROTATOR CUFF REPAIR        OB History   No obstetric history on file.     Family History  Problem Relation Age of Onset  . Hypertension Mother   . Diabetes Mother   . Kidney disease Mother   . CAD Father   . Diabetes Brother   . Diabetes Son   . Hypertension Son   . Hypertension Daughter     Social History   Tobacco Use  . Smoking status: Never Smoker  . Smokeless tobacco: Never Used  Substance Use Topics  . Alcohol use: Not Currently  . Drug use: Never    Home Medications Prior to Admission medications   Medication Sig Start Date End Date Taking? Authorizing Provider  benazepril (LOTENSIN) 20 MG tablet Take 20 mg by mouth daily.    [provider]  diclofenac Sodium (VOLTAREN) 1 % GEL Apply 4 g topically 4 (four) times daily. 11/23/19   Deno Etienne, DO  hydrochlorothiazide (HYDRODIURIL) 25 MG tablet Take 25 mg by mouth daily.    [provider]  metoprolol succinate (TOPROL-XL) 100 MG 24 hr tablet Take 100 mg by mouth 2 (two) times daily. Take with or immediately following a meal.    [provider]  NIFEdipine (PROCARDIA XL/ADALAT-CC) 90 MG 24 hr tablet Take 90 mg by mouth daily.    [provider]  omeprazole (PRILOSEC) 40 MG capsule Take 40 mg by mouth daily.    [provider]  pravastatin (PRAVACHOL) 80 MG tablet Take 80 mg by mouth daily.    [provider]    Allergies    Penicillins  Review of Systems   Review of Systems  Constitutional: Negative for chills and fever.  HENT: Negative for congestion and rhinorrhea.   Eyes: Negative for redness and visual disturbance.  Respiratory: Negative for shortness of breath and wheezing.   Cardiovascular: Negative for chest pain and palpitations.  Gastrointestinal: Negative for abdominal pain, nausea and vomiting.  Genitourinary: Negative for dysuria and urgency.  Musculoskeletal: Positive for arthralgias and myalgias.  Skin: Negative for pallor and wound.  Neurological: Negative for  dizziness and headaches.    Physical Exam Updated Vital Signs BP (!) 146/58 (BP Location: Left Wrist)   Pulse 74   Temp 98.3 F (36.8 C) (Oral)   Resp 20   Ht 5\' 1"  (1.549 m)   Wt 124.3 kg   SpO2 95%   BMI 51.77 kg/m   Physical Exam Vitals and nursing note reviewed.  Constitutional:      General: She is not in acute distress.    Appearance: She is well-developed. She is obese. She is not diaphoretic.  HENT:     Head: Normocephalic and atraumatic.  Eyes:     Pupils: Pupils are equal, round, and reactive to light.  Cardiovascular:     Rate and Rhythm: Normal rate and regular rhythm.     Heart sounds: No murmur heard.  No friction rub. No gallop.   Pulmonary:     Effort: Pulmonary effort is normal.     Breath sounds: No wheezing or rales.  Abdominal:     General: There is no distension.     Palpations: Abdomen is soft.     Tenderness: There is abdominal tenderness (mild llq).  Musculoskeletal:        General: Tenderness present.     Cervical back: Normal range of motion and neck supple.     Comments: Pain to the piriformis muscle belly.  Pulse motor and sensation are intact distally.  Reflexes are 2+ and equal.  No clonus.  Skin:    General: Skin is warm and dry.  Neurological:     Mental Status: She is alert and oriented to person, place, and time.  Psychiatric:        Behavior: Behavior normal.     ED Results / Procedures / Treatments   Labs (all labs ordered are listed, but only abnormal results are displayed) Labs Reviewed  CBC WITH DIFFERENTIAL/PLATELET - Abnormal; Notable for the following components:      Result Value   RBC 5.23 (*)    MCV 77.6 (*)    MCH 22.9 (*)    MCHC 29.6 (*)    All other components within normal limits  COMPREHENSIVE METABOLIC PANEL - Abnormal; Notable for the following components:   Creatinine, Ser 4.65 (*)    Calcium 8.0 (*)    Albumin 3.4 (*)    AST 12 (*)    Total Bilirubin 0.1 (*)    GFR calc non Af Amer 9 (*)    GFR  calc Af Amer 10 (*)    All other components within normal limits  CBG MONITORING, ED - Abnormal; Notable for the following components:   Glucose-Capillary 56 (*)    All other components within normal limits  CBG MONITORING, ED    EKG  None  Radiology CT L-SPINE NO CHARGE  Result Date: 11/23/2019 CLINICAL DATA:  Left hip pain for 2 months.  Pain with ambulation. EXAM: CT LUMBAR SPINE WITHOUT CONTRAST TECHNIQUE: Reformats of the lumbar spine was was generated from a renal stone CT. COMPARISON:  None similar there was a lumbar MRI in 1996, but images are no longer available. FINDINGS: Segmentation: 5 lumbar type vertebrae Alignment: Physiologic Vertebrae: No acute fracture or focal pathologic process. Paraspinal and other soft tissues: No acute finding. Disc levels: T12- L1: Unremarkable. L1-L2: Unremarkable. L2-L3: Degenerative facet spurring both sides. Mild, borderline moderate right foraminal narrowing. L3-L4: Mild disc narrowing and bulging. Degenerative facet spurring on both sides. Ligamentum flavum thickening and partial calcification. There is moderate or advanced spinal stenosis. Bilateral foraminal narrowing which is symmetric and likely noncompressive L4-L5: Severe facet osteoarthritis with bulky spurring, especially on the left where there is essentially complete effacement of the foramen. Ligamentum flavum thickening and probable disc bulging with high-grade spinal stenosis. Moderate range right foraminal narrowing L5-S1:Intervertebral and facet ankylosis or fusion. Endplate and facet ridging narrows both foramina, more high-grade on the left. No detected canal impingement IMPRESSION: 1. No emergent finding. 2. 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 3. L5-S1 ankylosis or fusion with left more than right foraminal stenosis. 4. Moderate or advanced spinal stenosis at L3-4 and L4-5. Electronically Signed   By:  Monte Fantasia M.D.   On: 11/23/2019 09:09   CT Renal Stone Study  Result Date: 11/23/2019 CLINICAL DATA:  Flank pain EXAM: CT ABDOMEN AND PELVIS WITHOUT CONTRAST TECHNIQUE: Multidetector CT imaging of the abdomen and pelvis was performed following the standard protocol without IV contrast. COMPARISON:  CT abdomen/pelvis dated 03/29/2019 FINDINGS: Lower chest: Lung bases are essentially clear. Hepatobiliary: Unenhanced liver is grossly unremarkable. Gallbladder is unremarkable. No intrahepatic or extrahepatic ductal dilatation. Pancreas: Within normal limits. Spleen: Within normal limits. Adrenals/Urinary Tract: Adrenal glands are within normal limits. 2.0 cyst in the left upper kidney (series 2/image 37). Right kidney is grossly unremarkable. No renal, ureteral, or bladder calculi. No hydronephrosis. Bladder is within normal limits. Stomach/Bowel: Stomach is notable for a small hiatal hernia. No evidence of bowel obstruction. Normal appendix (series 2/image 70). Left colonic diverticulosis, with mild pericolonic inflammatory changes in the left lower quadrant (series 2/image 69), suggesting mild sigmoid diverticulitis. No drainable fluid collection/abscess. No free air to suggest macroscopic perforation. Vascular/Lymphatic: No evidence of abdominal aortic aneurysm. Atherosclerotic calcifications of the abdominal aorta and branch vessels. No suspicious abdominopelvic lymphadenopathy. Reproductive: Status post hysterectomy. Right ovary is within normal limits.  No left adnexal mass. Other: No abdominopelvic ascites. Musculoskeletal: Mild degenerative changes of the lower lumbar spine. Dedicated lumbar spine imaging will be reported separately. IMPRESSION: Mild acute sigmoid diverticulitis. No drainable fluid collection/abscess. No free air. Additional ancillary findings as above. Electronically Signed   By: Julian Hy M.D.   On: 11/23/2019 09:04    Procedures Procedures (including critical care  time)  Medications Ordered in ED Medications  acetaminophen (TYLENOL) tablet 1,000 mg (1,000 mg Oral Given 11/23/19 0814)  oxyCODONE (Oxy IR/ROXICODONE) immediate release tablet 5 mg (5 mg Oral Given 11/23/19 0814)  ketorolac (TORADOL) 30 MG/ML injection 15 mg (15 mg Intravenous Given 11/23/19 5638)    ED Course  I have reviewed the triage vital signs and the nursing notes.  Pertinent labs & imaging results that were available during my care of the patient were reviewed by me and  considered in my medical decision making (see chart for details).  Clinical Course as of Nov 22 1056  Wed Nov 23, 2019  3810 Recurrent hypoglycemia, will feed here recheck.  I suspect this is due to her being on insulin and her chronic kidney disease.   [DF]    Clinical Course User Index [DF] Deno Etienne, DO   MDM Rules/Calculators/A&P                          69 yo F with a chief complaints of left-sided low back pain that radiates down to the knee.  Going on for 24 hours per her though he back at her notes it seems that she has had this pain off and on.  As she is describing this is a new pain and she has multiple red flags will obtain a CT scanning of the abdomen and back.  Lab work.  Patient was also profoundly hypoglycemic with EMS initially.  She tells me that she has been taking her diabetes medicines as prescribed and has not missed any meals.  We will obtain blood work observe and recheck blood sugar.  Repeat blood sugar was low.  Patient was able to eat without issue and after observation for an hour and is still normal.  We will have the patient hold her insulin.  Have her follow-up with her family doctor.  10:58 AM:  I have discussed the diagnosis/risks/treatment options with the patient and believe the pt to be eligible for discharge home to follow-up with PCP. We also discussed returning to the ED immediately if new or worsening sx occur. We discussed the sx which are most concerning (e.g., sudden  worsening pain, fever, inability to tolerate by mouth) that necessitate immediate return. Medications administered to the patient during their visit and any new prescriptions provided to the patient are listed below.  Medications given during this visit Medications  acetaminophen (TYLENOL) tablet 1,000 mg (1,000 mg Oral Given 11/23/19 0814)  oxyCODONE (Oxy IR/ROXICODONE) immediate release tablet 5 mg (5 mg Oral Given 11/23/19 0814)  ketorolac (TORADOL) 30 MG/ML injection 15 mg (15 mg Intravenous Given 11/23/19 1751)     The patient appears reasonably screen and/or stabilized for discharge and I doubt any other medical condition or other [facility] requiring further screening, evaluation, or treatment in the ED at this time prior to discharge.   Final Clinical Impression(s) / ED Diagnoses Final diagnoses:  Sciatica  Hypoglycemia    Rx / DC Orders ED Discharge Orders         Ordered    diclofenac Sodium (VOLTAREN) 1 % GEL  4 times daily        11/23/19 [place], Kalai Baca, DO 11/23/19 1058

## 2021-06-23 IMAGING — CT CT RENAL STONE PROTOCOL
2 of 4 series · 16 of 46 positions shown, 18 images · non-contrast
Comparison: CT abdomen/pelvis dated 03/29/2019

CLINICAL DATA: Flank pain

EXAM:
CT ABDOMEN AND PELVIS WITHOUT CONTRAST
TECHNIQUE: Multidetector CT imaging of the abdomen and pelvis was performed
following the standard protocol without IV contrast.

[Series 2: axial st · axial · 0.98mm/px · z∈[-501,-76]mm · 13 of 95 slices shown, 15 images]
[im 5/95  soft-tissue]
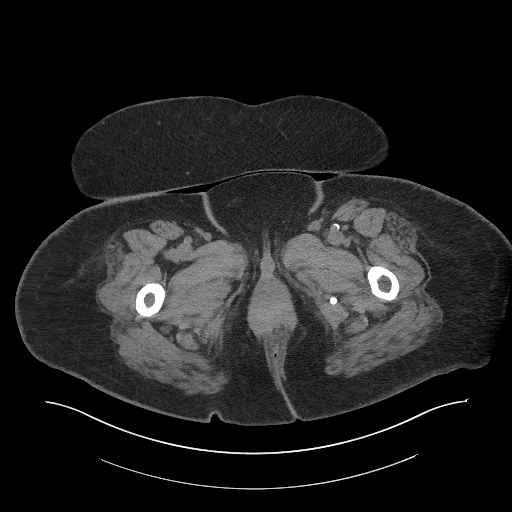
[im 5/95  bone]
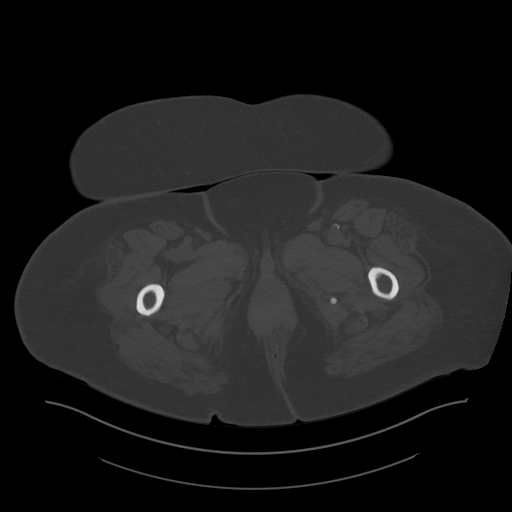
[im 13/95  soft-tissue]
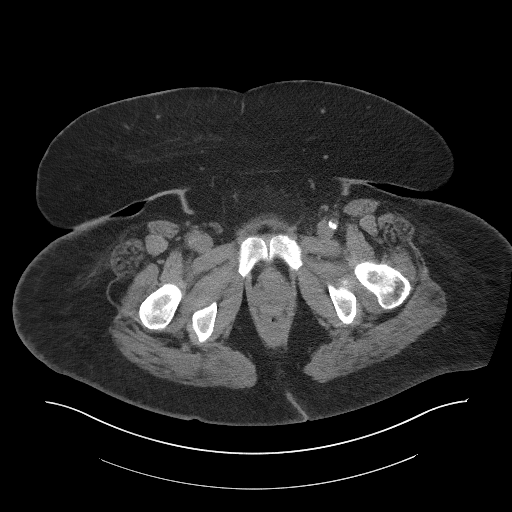
[im 21/95  soft-tissue]
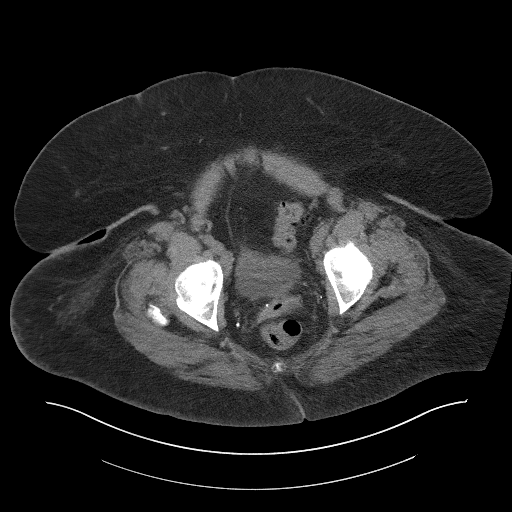
[im 25/95  soft-tissue]
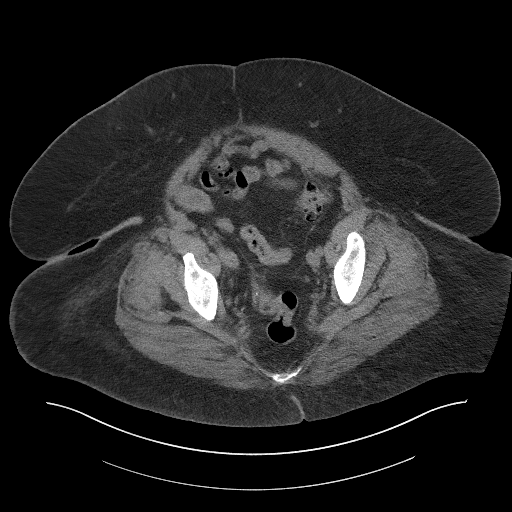
[im 33/95  soft-tissue]
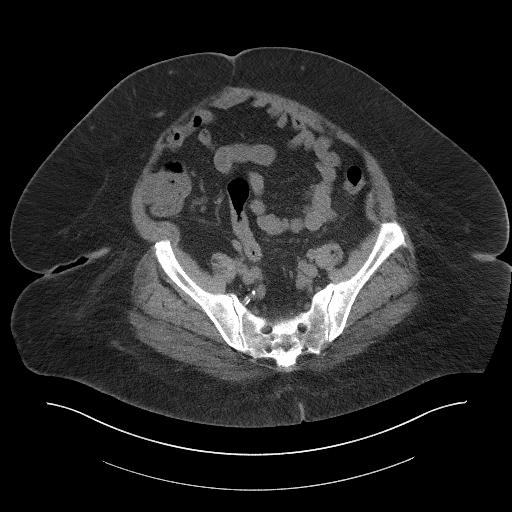
[im 41/95  soft-tissue]
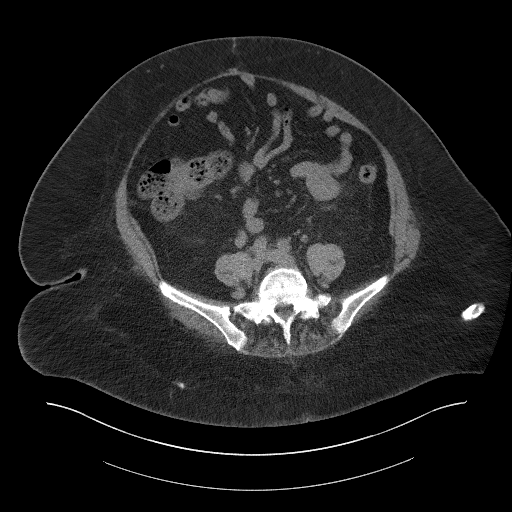
[im 50/95  soft-tissue]
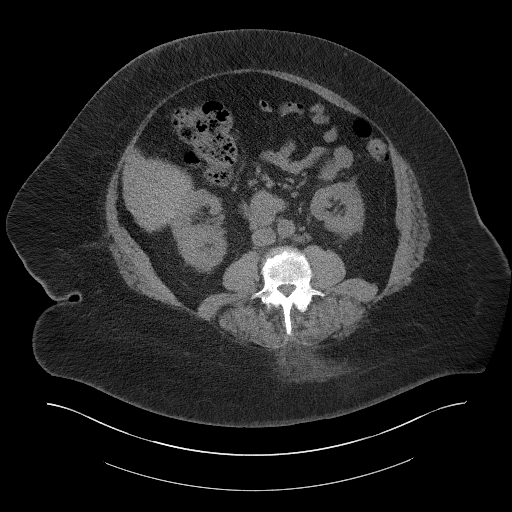
[im 54/95  soft-tissue]
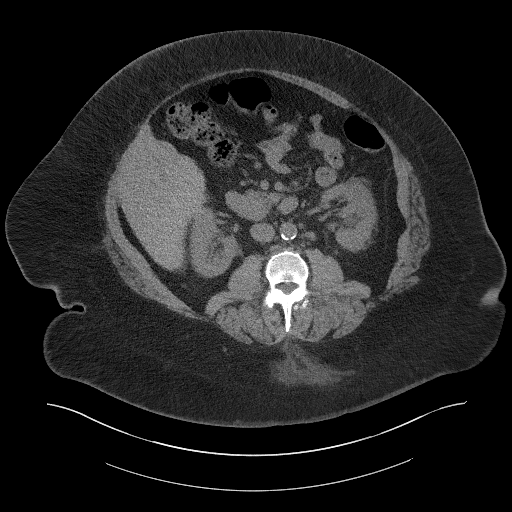
[im 62/95  soft-tissue]
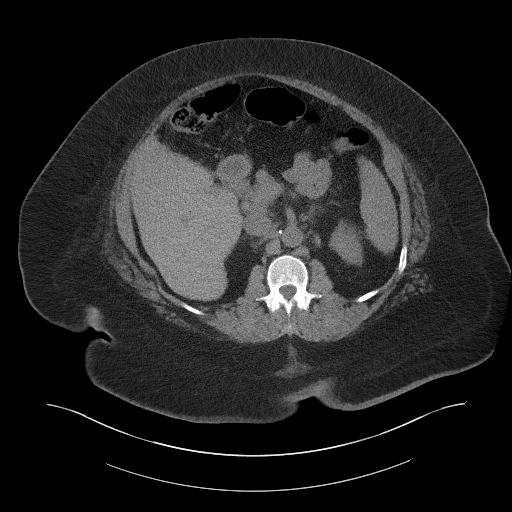
[im 62/95  bone]
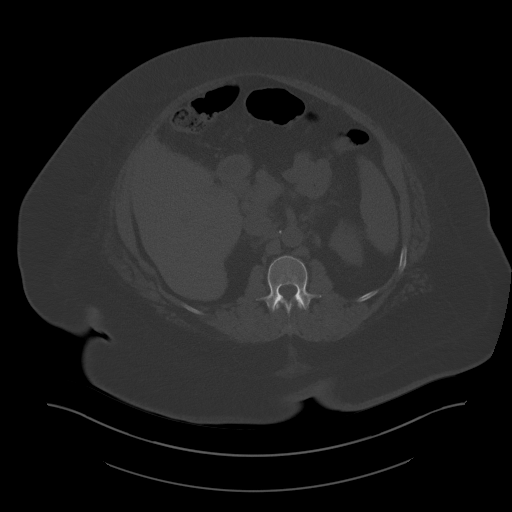
[im 70/95  soft-tissue]
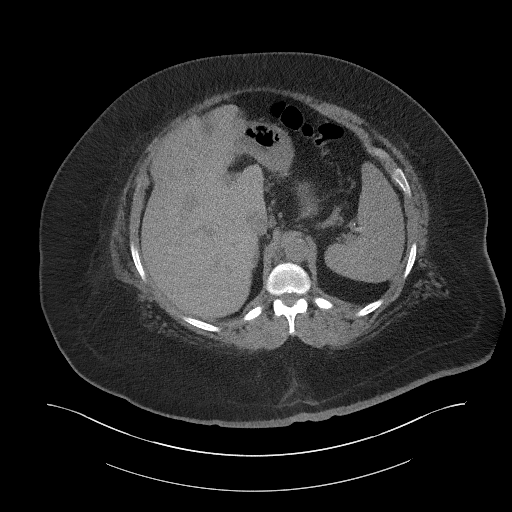
[im 74/95  soft-tissue]
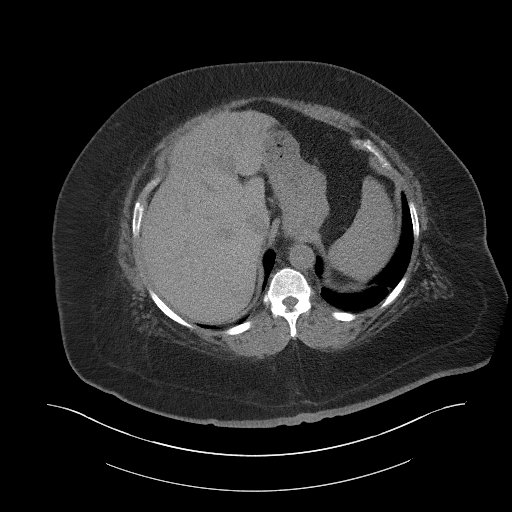
[im 82/95  soft-tissue]
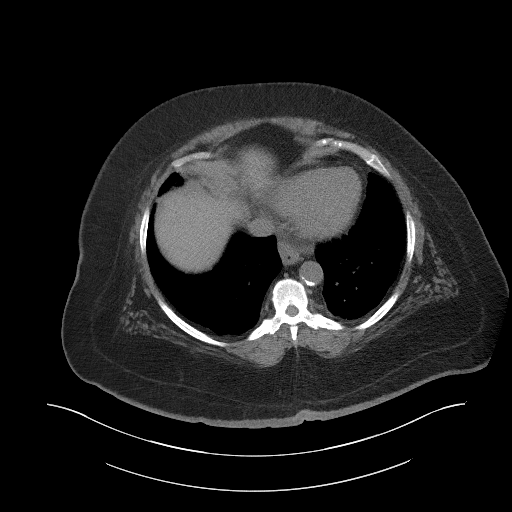
[im 90/95  soft-tissue]
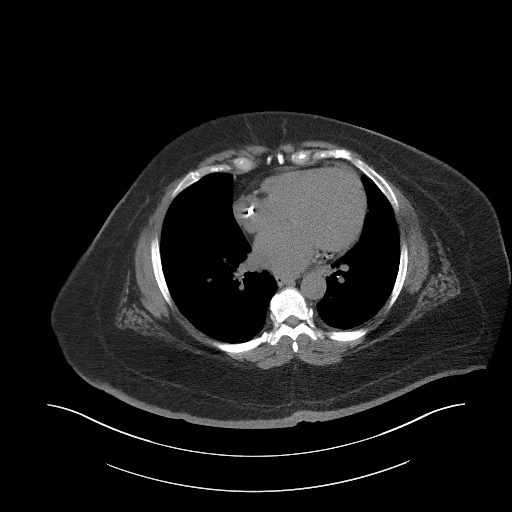

[Series 5: coronal st · coronal · 0.93mm/px · 3 of 134 slices shown]
[im 45/134  soft-tissue]
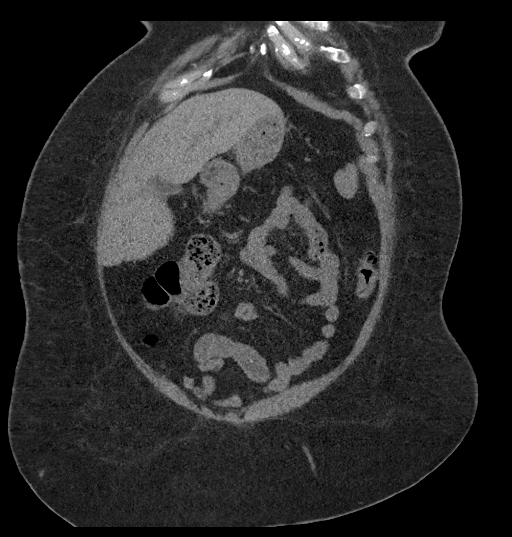
[im 60/134  soft-tissue]
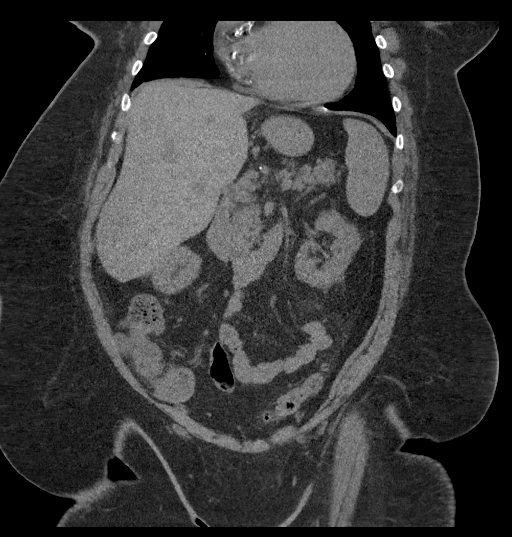
[im 74/134  soft-tissue]
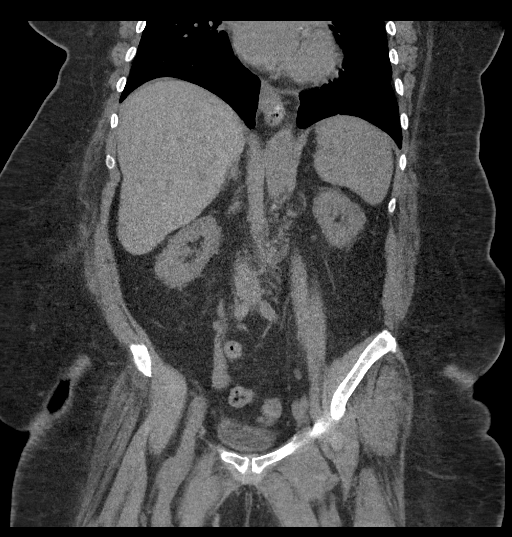

[16 of 46 positions shown; findings below may reference images not displayed]

FINDINGS: Lower chest: Lung bases are essentially clear.

Hepatobiliary: Unenhanced liver is grossly unremarkable.

Gallbladder is unremarkable. No intrahepatic or extrahepatic ductal
dilatation.

Pancreas: Within normal limits.

Spleen: Within normal limits.

Adrenals/Urinary Tract: Adrenal glands are within normal limits.

2.0 cyst in the left upper kidney (series 2/image 37). Right kidney
is grossly unremarkable. No renal, ureteral, or bladder calculi. No
hydronephrosis.

Bladder is within normal limits.

Stomach/Bowel: Stomach is notable for a small hiatal hernia.

No evidence of bowel obstruction.

Normal appendix (series 2/image 70).

Left colonic diverticulosis, with mild pericolonic inflammatory
changes in the left lower quadrant (series 2/image 69), suggesting
mild sigmoid diverticulitis.

No drainable fluid collection/abscess. No free air to suggest
macroscopic perforation.

Vascular/Lymphatic: No evidence of abdominal aortic aneurysm.

Atherosclerotic calcifications of the abdominal aorta and branch
vessels.

No suspicious abdominopelvic lymphadenopathy.

Reproductive: Status post hysterectomy.

Right ovary is within normal limits.  No left adnexal mass.

Other: No abdominopelvic ascites.

Musculoskeletal: Mild degenerative changes of the lower lumbar
spine. Dedicated lumbar spine imaging will be reported separately.
IMPRESSION: Mild acute sigmoid diverticulitis. No drainable fluid
collection/abscess. No free air.

Additional ancillary findings as above.

## 2021-06-23 IMAGING — CT CT L SPINE W/O CM
3 series · 13 of 33 positions shown, 16 images · non-contrast
Comparison: None similar there was a lumbar MRI in 3995, but images
are no longer available.

CLINICAL DATA: Left hip pain for 2 months.  Pain with ambulation.

EXAM:
CT LUMBAR SPINE WITHOUT CONTRAST
TECHNIQUE: Reformats of the lumbar spine was was generated from a renal stone
CT.

[Series 8: lumbar coronal bone · coronal · 0.33mm/px · 3 of 53 slices shown]
[im 11/53  bone]
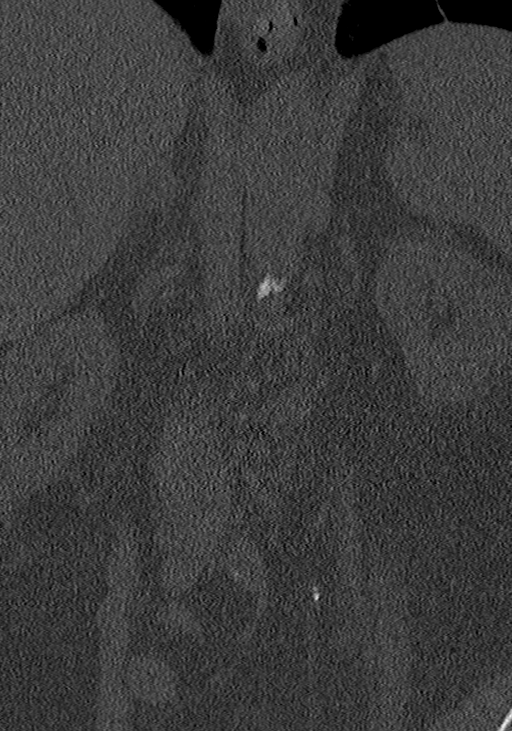
[im 21/53  bone]
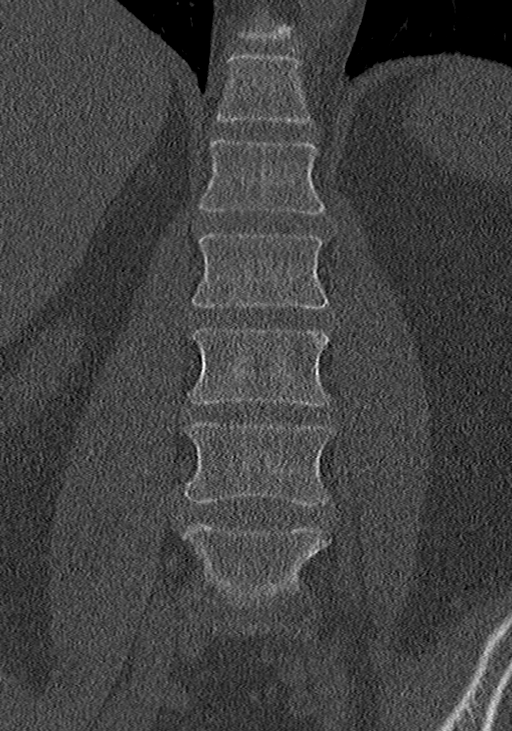
[im 32/53  bone]
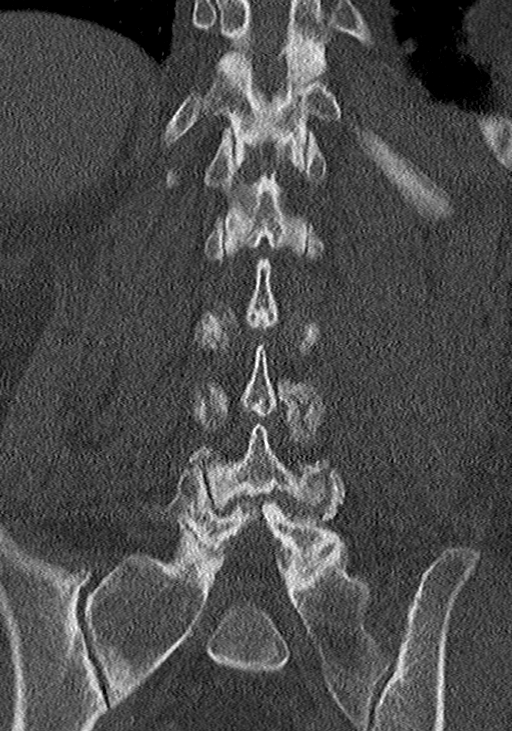

[Series 9: lumbar sagittal bone · sagittal · 0.34mm/px · 5 of 53 slices shown, 6 images]
[im 18/53  bone]
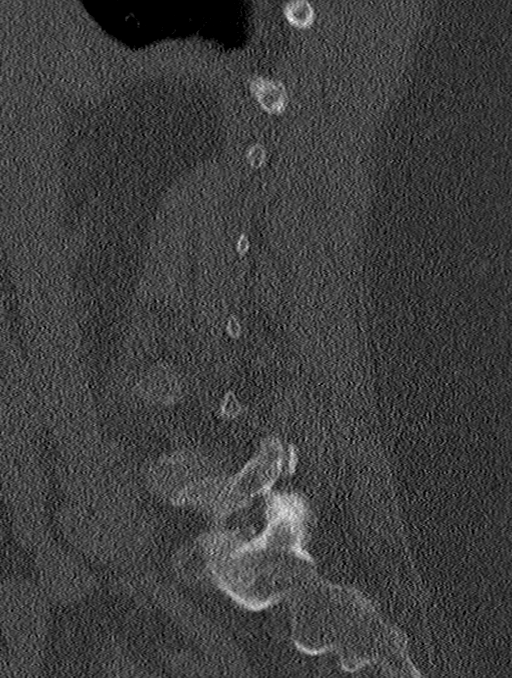
[im 22/53  bone]
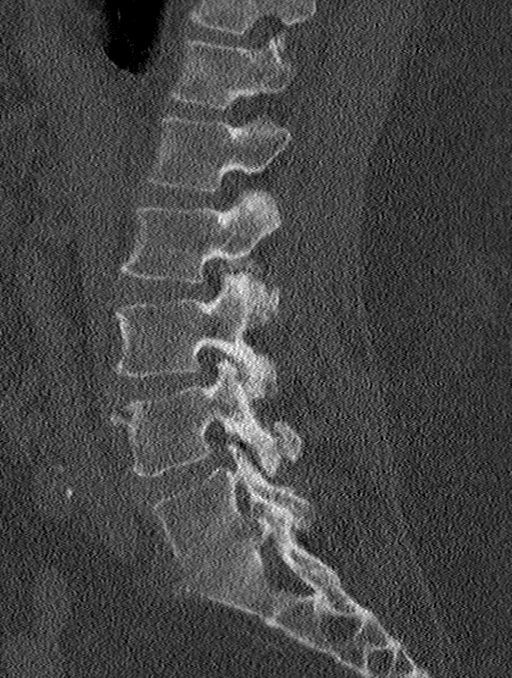
[im 27/53  soft-tissue]
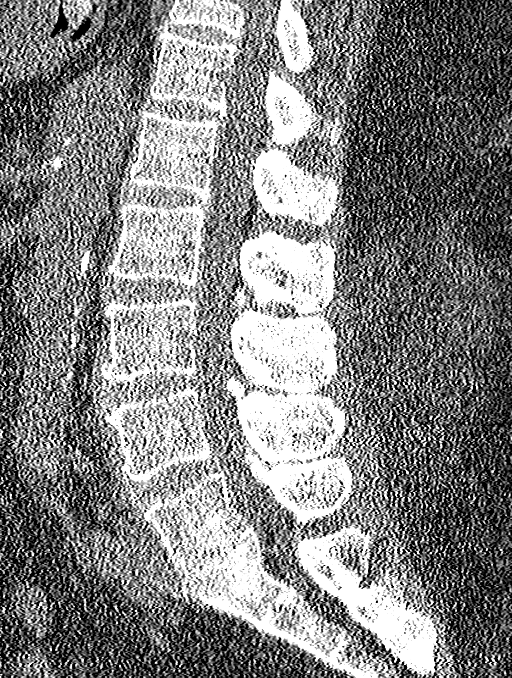
[im 27/53  bone]
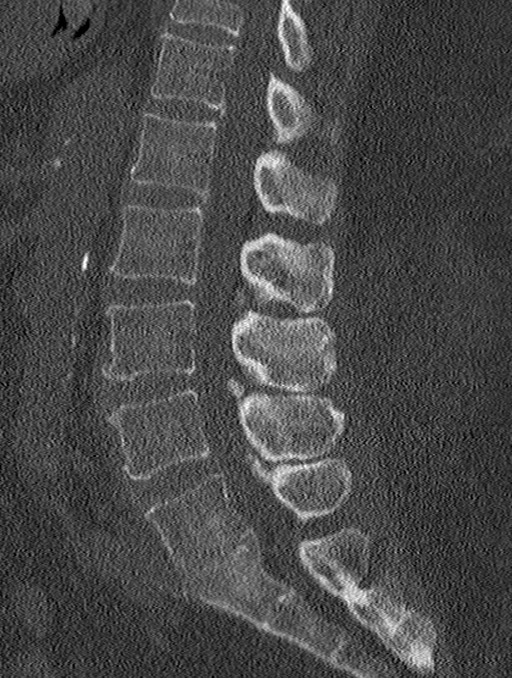
[im 31/53  bone]
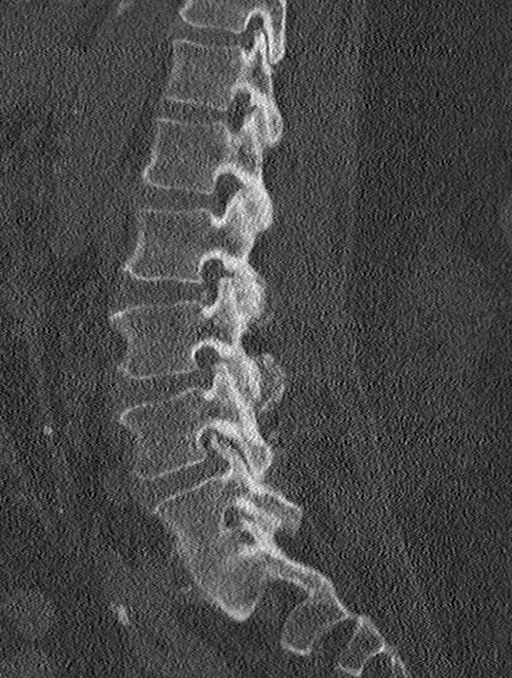
[im 35/53  bone]
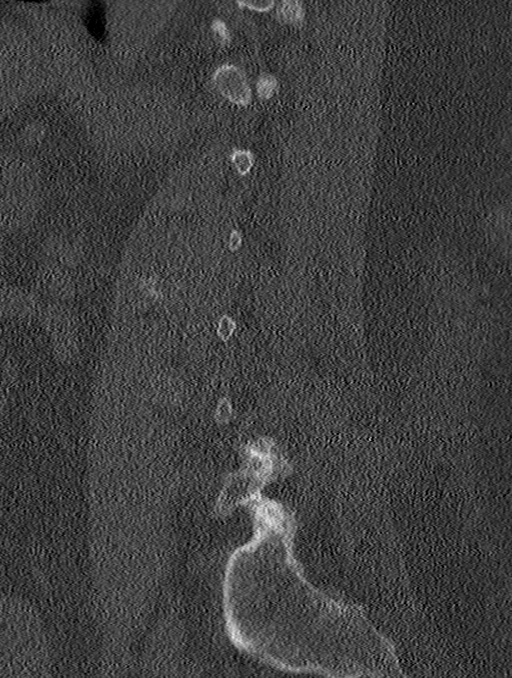

[Series 10: lumbar axial bone · axial · 0.31mm/px · z∈[-317,-182]mm · 5 of 39 slices shown, 7 images]
[im 6/39  soft-tissue]
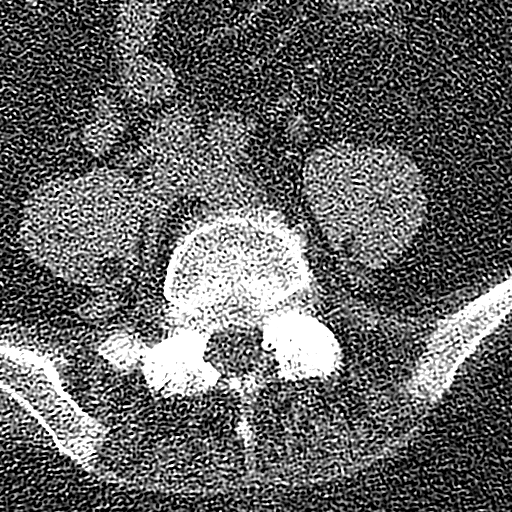
[im 6/39  bone]
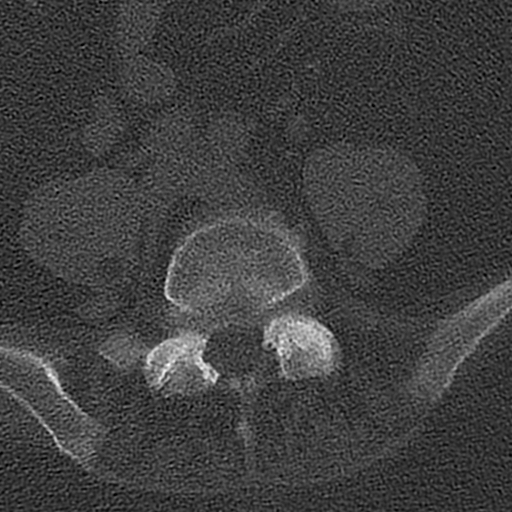
[im 12/39  bone]
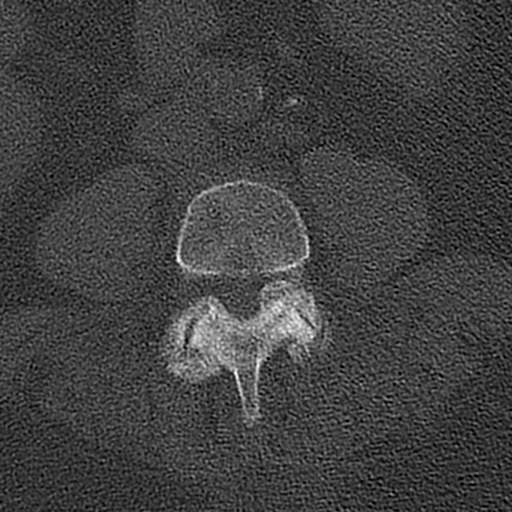
[im 21/39  bone]
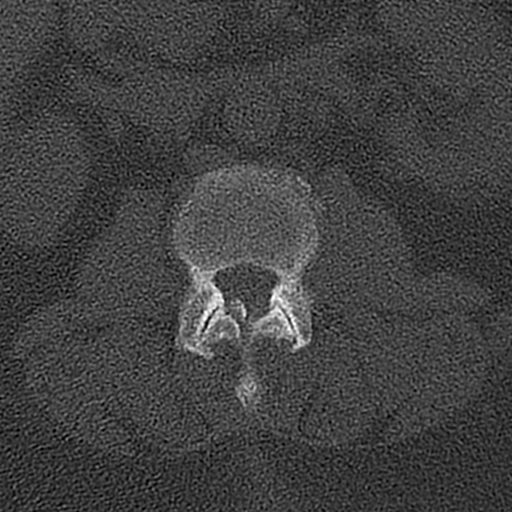
[im 27/39  bone]
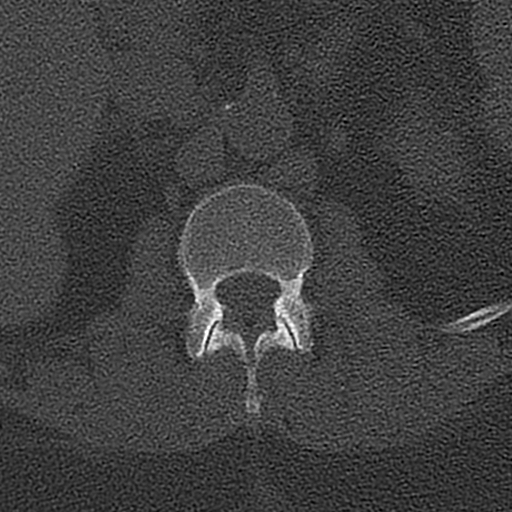
[im 33/39  soft-tissue]
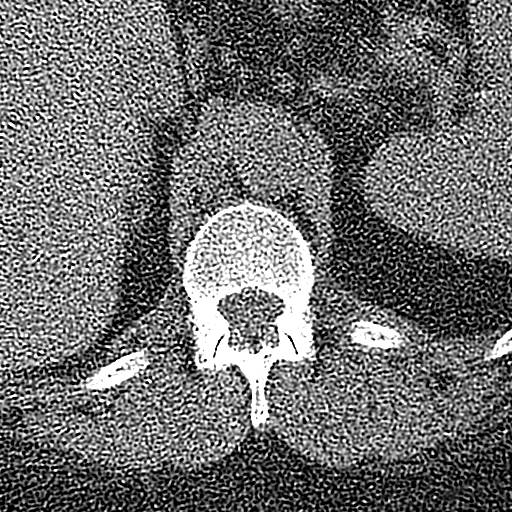
[im 33/39  bone]
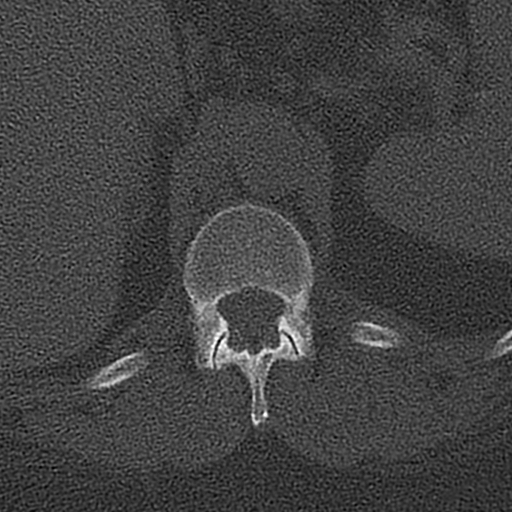

[13 of 33 positions shown; findings below may reference images not displayed]

FINDINGS: Segmentation: 5 lumbar type vertebrae

Alignment: Physiologic

Vertebrae: No acute fracture or focal pathologic process.

Paraspinal and other soft tissues: No acute finding.

Disc levels:

T12- L1: Unremarkable.

L1-L2: Unremarkable.

L2-L3: Degenerative facet spurring both sides. Mild, borderline
moderate right foraminal narrowing.

L3-L4: Mild disc narrowing and bulging. Degenerative facet spurring
on both sides. Ligamentum flavum thickening and partial
calcification. There is moderate or advanced spinal stenosis.
Bilateral foraminal narrowing which is symmetric and likely
noncompressive

L4-L5: Severe facet osteoarthritis with bulky spurring, especially
on the left where there is essentially complete effacement of the
foramen. Ligamentum flavum thickening and probable disc bulging with
high-grade spinal stenosis. Moderate range right foraminal narrowing

L5-S1:Intervertebral and facet ankylosis or fusion. Endplate and
facet ridging narrows both foramina, more high-grade on the left. No
detected canal impingement
IMPRESSION: 1. No emergent finding.
2. Facet osteoarthritis at L2-3 and below, particularly advanced at
L4-5 where there is severe left foraminal impingement.
3. L5-S1 ankylosis or fusion with left more than right foraminal
stenosis.
4. Moderate or advanced spinal stenosis at L3-4 and L4-5.

## 2022-07-02 ENCOUNTER — Encounter (HOSPITAL_BASED_OUTPATIENT_CLINIC_OR_DEPARTMENT_OTHER): Payer: Self-pay | Admitting: Emergency Medicine

## 2022-07-02 ENCOUNTER — Other Ambulatory Visit: Payer: Self-pay

## 2022-07-02 ENCOUNTER — Emergency Department (HOSPITAL_BASED_OUTPATIENT_CLINIC_OR_DEPARTMENT_OTHER): Payer: Medicare HMO

## 2022-07-02 ENCOUNTER — Emergency Department (HOSPITAL_BASED_OUTPATIENT_CLINIC_OR_DEPARTMENT_OTHER)
Admission: EM | Admit: 2022-07-02 | Discharge: 2022-07-02 | Disposition: A | Discharge status: Home / Self Care | Payer: Medicare HMO | Attending: Emergency Medicine | Admitting: Emergency Medicine

## 2022-07-02 DIAGNOSIS — R197 Diarrhea, unspecified: Secondary | ICD-10-CM | POA: Diagnosis not present

## 2022-07-02 DIAGNOSIS — I130 Hypertensive heart and chronic kidney disease with heart failure and stage 1 through stage 4 chronic kidney disease, or unspecified chronic kidney disease: Secondary | ICD-10-CM | POA: Insufficient documentation

## 2022-07-02 DIAGNOSIS — N184 Chronic kidney disease, stage 4 (severe): Secondary | ICD-10-CM | POA: Diagnosis not present

## 2022-07-02 DIAGNOSIS — R112 Nausea with vomiting, unspecified: Secondary | ICD-10-CM | POA: Diagnosis not present

## 2022-07-02 DIAGNOSIS — E1122 Type 2 diabetes mellitus with diabetic chronic kidney disease: Secondary | ICD-10-CM | POA: Insufficient documentation

## 2022-07-02 DIAGNOSIS — Z856 Personal history of leukemia: Secondary | ICD-10-CM | POA: Insufficient documentation

## 2022-07-02 DIAGNOSIS — I5030 Unspecified diastolic (congestive) heart failure: Secondary | ICD-10-CM | POA: Diagnosis not present

## 2022-07-02 DIAGNOSIS — R030 Elevated blood-pressure reading, without diagnosis of hypertension: Secondary | ICD-10-CM

## 2022-07-02 DIAGNOSIS — R1084 Generalized abdominal pain: Secondary | ICD-10-CM | POA: Diagnosis not present

## 2022-07-02 LAB — COMPREHENSIVE METABOLIC PANEL
ALT: 8 U/L (ref 0–44)
AST: 15 U/L (ref 15–41)
Albumin: 3 g/dL — ABNORMAL LOW (ref 3.5–5.0)
Alkaline Phosphatase: 65 U/L (ref 38–126)
Anion gap: 10 (ref 5–15)
BUN: 24 mg/dL — ABNORMAL HIGH (ref 8–23)
CO2: 28 mmol/L (ref 22–32)
Calcium: 7.3 mg/dL — ABNORMAL LOW (ref 8.9–10.3)
Chloride: 94 mmol/L — ABNORMAL LOW (ref 98–111)
Creatinine, Ser: 5.71 mg/dL — ABNORMAL HIGH (ref 0.44–1.00)
GFR, Estimated: 7 mL/min — ABNORMAL LOW (ref >60)
Glucose, Bld: 300 mg/dL — ABNORMAL HIGH (ref 70–99)
Potassium: 5 mmol/L (ref 3.5–5.1)
Sodium: 132 mmol/L — ABNORMAL LOW (ref 135–145)
Total Bilirubin: 0.3 mg/dL (ref 0.3–1.2)
Total Protein: 5.9 g/dL — ABNORMAL LOW (ref 6.5–8.1)

## 2022-07-02 LAB — CBC WITH DIFFERENTIAL/PLATELET
Abs Immature Granulocytes: 0.01 10*3/uL (ref 0.00–0.07)
Basophils Absolute: 0 10*3/uL (ref 0.0–0.1)
Basophils Relative: 0 %
Eosinophils Absolute: 0.1 10*3/uL (ref 0.0–0.5)
Eosinophils Relative: 2 %
HCT: 30.8 % — ABNORMAL LOW (ref 36.0–46.0)
Hemoglobin: 9.3 g/dL — ABNORMAL LOW (ref 12.0–15.0)
Immature Granulocytes: 0 %
Lymphocytes Relative: 37 %
Lymphs Abs: 2 10*3/uL (ref 0.7–4.0)
MCH: 21.4 pg — ABNORMAL LOW (ref 26.0–34.0)
MCHC: 30.2 g/dL (ref 30.0–36.0)
MCV: 71 fL — ABNORMAL LOW (ref 80.0–100.0)
Monocytes Absolute: 0.4 10*3/uL (ref 0.1–1.0)
Monocytes Relative: 8 %
Neutro Abs: 2.7 10*3/uL (ref 1.7–7.7)
Neutrophils Relative %: 53 %
Platelets: 165 10*3/uL (ref 150–400)
RBC: 4.34 MIL/uL (ref 3.87–5.11)
RDW: 15.7 % — ABNORMAL HIGH (ref 11.5–15.5)
WBC: 5.3 10*3/uL (ref 4.0–10.5)
nRBC: 0 % (ref 0.0–0.2)

## 2022-07-02 LAB — LACTIC ACID, PLASMA: Lactic Acid, Venous: 1.4 mmol/L (ref 0.5–1.9)

## 2022-07-02 LAB — LIPASE, BLOOD: Lipase: 21 U/L (ref 11–51)

## 2022-07-02 MED ORDER — CLONIDINE HCL 0.1 MG PO TABS
0.1000 mg | ORAL_TABLET | Freq: Once | ORAL | Status: AC
  Administered 2022-07-02: 0.1 mg via ORAL
  Filled 2022-07-02: qty 1

## 2022-07-02 MED ORDER — ONDANSETRON HCL 4 MG/2ML IJ SOLN
4.0000 mg | Freq: Once | INTRAMUSCULAR | Status: DC
  Filled 2022-07-02: qty 2

## 2022-07-02 MED ORDER — ONDANSETRON 4 MG PO TBDP
4.0000 mg | ORAL_TABLET | Freq: Once | ORAL | Status: AC
  Administered 2022-07-02: 4 mg via ORAL
  Filled 2022-07-02: qty 1

## 2022-07-02 MED ORDER — SODIUM CHLORIDE 0.9 % IV BOLUS: 500.0000 mL | Freq: Once | INTRAVENOUS | Status: DC

## 2022-07-02 NOTE — ED Provider Notes (Signed)
Emergency Department Provider Note   I have reviewed the triage vital signs and the nursing notes.   HISTORY  Chief Complaint Abdominal Pain   HPI Amy Bush is a 72 y.o. female past history of CKD, DM, HTN, HLD, and OSA presents emergency department with diffuse abdominal pain along with nausea and vomiting.  Symptoms began yesterday evening after eating at [employer].  She reports no one else she was with got sick.  No fevers or chills.  She has diffuse cramping abdominal pain along with nonbloody vomiting and diarrhea keeping her up through most the night.  No radiation into the chest or shortness of breath.  Generalized weakness.  No new medications.   Past Medical History:  Diagnosis Date   Aneurysm of posterior cerebral artery    CKD (chronic kidney disease) stage 4, GFR 15-29 ml/min    CLL (chronic lymphocytic leukemia)    Diabetes mellitus    Diastolic CHF    Diplopia    Hyperlipidemia    Hypertension    LVH (left ventricular hypertrophy)    Microcytic anemia    Ocular hypertension    OSA (obstructive sleep apnea)    VI nerve palsy, left     Review of Systems  Constitutional: No fever/chills Cardiovascular: Denies chest pain. Respiratory: Denies shortness of breath. Gastrointestinal: Positive abdominal pain. Positive nausea and vomiting.  No diarrhea.  No constipation. Genitourinary: Negative for dysuria. Musculoskeletal: Negative for back pain. Skin: Negative for rash. Neurological: Negative for headaches.  ____________________________________________   PHYSICAL EXAM:  VITAL SIGNS: ED Triage Vitals  Enc Vitals Group     BP 07/02/22 0931 (!) 217/97     Pulse Rate 07/02/22 0931 79     Resp 07/02/22 0931 (!) 22     Temp 07/02/22 0931 98.3 F (36.8 C)     Temp Source 07/02/22 0931 Oral     SpO2 07/02/22 0931 96 %     Weight 07/02/22 0929 212 lb (96.2 kg)     Height 07/02/22 0929 5\' 2"  (1.575 m)   Constitutional: Alert and oriented. Well  appearing and in no acute distress. Eyes: Conjunctivae are normal.  Head: Atraumatic. Nose: No congestion/rhinnorhea. Mouth/Throat: Mucous membranes are moist.   Neck: No stridor.   Cardiovascular: Normal rate, regular rhythm. Good peripheral circulation. Grossly normal heart sounds.   Respiratory: Normal respiratory effort.  No retractions. Lungs CTAB. Gastrointestinal: Soft with mild diffuse tenderness. No peritonitis. No distention.  Musculoskeletal: No gross deformities of extremities. Neurologic:  Normal speech and language. No gross focal neurologic deficits are appreciated.  Skin:  Skin is warm, dry and intact. No rash noted.  ____________________________________________   LABS (all labs ordered are listed, but only abnormal results are displayed)  Labs Reviewed  COMPREHENSIVE METABOLIC PANEL - Abnormal; Notable for the following components:      Result Value   Sodium 132 (*)    Chloride 94 (*)    Glucose, Bld 300 (*)    BUN 24 (*)    Creatinine, Ser 5.71 (*)    Calcium 7.3 (*)    Total Protein 5.9 (*)    Albumin 3.0 (*)    GFR, Estimated 7 (*)    All other components within normal limits  CBC WITH DIFFERENTIAL/PLATELET - Abnormal; Notable for the following components:   Hemoglobin 9.3 (*)    HCT 30.8 (*)    MCV 71.0 (*)    MCH 21.4 (*)    RDW 15.7 (*)    All  other components within normal limits  LIPASE, BLOOD  LACTIC ACID, PLASMA   ____________________________________________  EKG   EKG Interpretation  Date/Time:  Wednesday July 02 2022 09:42:28 EDT Ventricular Rate:  74 PR Interval:  139 QRS Duration: 81 QT Interval:  455 QTC Calculation: 505 R Axis:   41 Text Interpretation: Sinus rhythm Probable left atrial enlargement Probable left ventricular hypertrophy Nonspecific T abnormalities, lateral leads Prolonged QT interval Similar to prior Confirmed by Nanda Quinton [phone]) on 07/02/2022 10:19:24 AM         ____________________________________________  RADIOLOGY  CT ABDOMEN PELVIS WO CONTRAST  Result Date: 07/02/2022 CLINICAL DATA:  Abdominal pain with nausea and vomiting since last night EXAM: CT ABDOMEN AND PELVIS WITHOUT CONTRAST TECHNIQUE: Multidetector CT imaging of the abdomen and pelvis was performed following the standard protocol without IV contrast. RADIATION DOSE REDUCTION: This exam was performed according to the departmental dose-optimization program which includes automated exposure control, adjustment of the mA and/or kV according to patient size and/or use of iterative reconstruction technique. COMPARISON:  11/23/2019 FINDINGS: Lower chest: Trace LEFT pleural effusion with mild bibasilar atelectasis Hepatobiliary: Gallbladder and liver normal appearance Pancreas: Atrophic pancreas without mass Spleen: Normal appearance Adrenals/Urinary Tract: Adrenal thickening without mass. BILATERAL renal cortical atrophy. Tiny nonobstructing calculus upper pole RIGHT kidney. Complicated cyst with a calcification at upper pole LEFT kidney, 16 x 17 mm. No additional renal masses or hydronephrosis. Ureters and bladder otherwise unremarkable. Stomach/Bowel: Bowel loops normal appearance. No bowel dilatation or bowel wall thickening. Stomach underdistended with suboptimal assessment of wall thickness. Small hiatal hernia. Appendix not visualized. Vascular/Lymphatic: Extensive atherosclerotic calcifications aorta, iliac arteries, coronary arteries. Few scattered normal sized retroperitoneal nodes. No adenopathy. Reproductive: Uterus surgically absent. Nonvisualization of LEFT ovary with unremarkable RIGHT ovary. Other: Scattered pelvic phleboliths. Small supraumbilical ventral fascial defect with minimal herniation of fat. Tiny umbilical hernia with fat. Musculoskeletal: Osseous demineralization. Subcutaneous infiltrative changes focally in the RIGHT mid abdomen question medication injection site. Superior  endplate compression deformities of T11 and L1 new since 2021 but age indeterminate. IMPRESSION: Small hiatal hernia. Tiny nonobstructing RIGHT renal calculus. Complicated cyst with a calcification at upper pole LEFT kidney, 16 x 17 mm; follow-up characterization by renal protocol MR or CT with and without contrast recommended. Small supraumbilical and umbilical hernias with fat. Superior endplate compression deformities of T11 and L1 new since 2021 but age indeterminate. Extensive atherosclerotic disease changes including coronary arteries. Trace LEFT pleural effusion with mild bibasilar atelectasis. Aortic Atherosclerosis (ICD10-I70.0). Electronically Signed   By: Lavonia Dana M.D.   On: 07/02/2022 11:42    ____________________________________________   PROCEDURES  Procedure(s) performed:   Procedures  None  ____________________________________________   INITIAL IMPRESSION / ASSESSMENT AND PLAN / ED COURSE  Pertinent labs & imaging results that were available during my care of the patient were reviewed by me and considered in my medical decision making (see chart for details).   This patient is Presenting for Evaluation of abdominal pain, which does require a range of treatment options, and is a complaint that involves a high risk of morbidity and mortality.  The Differential Diagnoses includes but is not exclusive to acute cholecystitis, intrathoracic causes for epigastric abdominal pain, gastritis, duodenitis, pancreatitis, small bowel or large bowel obstruction, abdominal aortic aneurysm, hernia, gastritis, etc.   Critical Interventions-    Medications  ondansetron (ZOFRAN-ODT) disintegrating tablet 4 mg (4 mg Oral Given 07/02/22 1112)  cloNIDine (CATAPRES) tablet 0.1 mg (0.1 mg Oral Given 07/02/22 1210)  cloNIDine (CATAPRES)  tablet 0.1 mg (0.1 mg Oral Given 07/02/22 1321)    Reassessment after intervention: symptoms improved. Symptoms improved.     Clinical Laboratory Tests Ordered,  included   Radiologic Tests Ordered, included CT abdomen/pelvis. I independently interpreted the images and agree with radiology interpretation.   Cardiac Monitor Tracing which shows NSR.    Social Determinants of Health Risk patient is a non-smoker.   Medical Decision Making: Summary:  Patient presents emergency department nausea, vomiting, diarrhea.  Abdominal discomfort is cramping in nature and more diffuse.  Suspect either foodborne illness versus viral GI symptoms.  Given age plan for CT imaging and screening blood work along with symptom mgmt in the ED.   Reevaluation with update and discussion with labs and CT imaging reassuring. Patient is feeling much better on reassessment. BP is high but no symptoms. Patient takes clonidine PRN for high pressure at home. Plan was to observe in the ED for BP to down-trend but patient advises that she is feeling well and has to be in court very soon and so must leave. Advised strict ED return precautions, BP med compliance, and to keep HD appointment in the AM.   Considered admission but workup reassuring and patient requesting immediate discharge due to court date. Asymptomatic HTN at this time but would have opted for additional ED observation time. Patient understands but advises she is feeling well and cannot stay longer.   Patient's presentation is most consistent with acute presentation with potential threat to life or bodily function.   Disposition: discharge  ____________________________________________  FINAL CLINICAL IMPRESSION(S) / ED DIAGNOSES  Final diagnoses:  Generalized abdominal pain  Nausea vomiting and diarrhea  Elevated blood pressure reading    Note:  This document was prepared using Dragon voice recognition software and may include unintentional dictation errors.  Nanda Quinton, MD, [facility]    Sai Zinn, [facility], MD 07/03/22 1155

## 2022-07-02 NOTE — ED Notes (Signed)
Pt placed on 3L O2 via [place] due to O2 sats decreasing to 74% on room air while asleep.  O2 sats immediately increase to high 90's on room air while pt awake. RT made aware. EDP Long made aware.

## 2022-07-02 NOTE — ED Notes (Signed)
No vomiting observed since PTA

## 2022-07-02 NOTE — ED Triage Notes (Signed)
Abdominal pain with N/V since last night.  Unknown fever.  No diarrhea.

## 2022-07-02 NOTE — ED Notes (Signed)
Peripheral IV x2 unsuccesful. Attempted x 2 with [place]. EDP able to obtain blood via IV  and sent to lab, IV infiltrated when flushed. Pt requesting to stop attempts at this time. Zofran changed to ODT. Grand daughter at bedside and updated

## 2022-07-02 NOTE — ED Notes (Signed)
Pt reports feeling better. Abdominal pain, nausea and vomiting resolved EDP notified of elevated BP. Pt reports she took her BP medication this morning. Pt has HD cath present to left chest. Dressing CDI. Last dialysis yesterday.

## 2022-07-02 NOTE — ED Notes (Addendum)
D/c paperwork reviewed with pt, including follow up care.  No questions or concerns voiced at time of d/c. Marland Kitchen Pt verbalized understanding, Wheeled by ED staff to ED exit, NAD.    Pt educated to take BP meds as prescribed, continue dialysis per her tue/thurs/sat schedule, wear CPAP at home. Pt verbalized understanding, family at bedside reiterated.

## 2022-07-02 NOTE — ED Notes (Signed)
Transported to CT 

## 2022-07-02 NOTE — ED Notes (Signed)
Family at bedside. 

## 2022-07-02 NOTE — Discharge Instructions (Signed)

## 2022-07-02 NOTE — ED Notes (Signed)
Pts granddaughter [name] phone number [phone] for transport home.

## 2022-07-02 NOTE — ED Notes (Signed)
Pt requesting to be discharged due to pending appt that is "very important."  EDP made aware.
